# Patient Record
Sex: Male | Born: 1965 | Race: Black or African American | Hispanic: No
Health system: Southern US, Community
[De-identification: ages and names within clinical notes are randomized; demographics above are authoritative.]

## PROBLEM LIST (undated history)

## (undated) DIAGNOSIS — S060XAA Concussion with loss of consciousness status unknown, initial encounter: Secondary | ICD-10-CM

## (undated) DIAGNOSIS — S060X9A Concussion with loss of consciousness of unspecified duration, initial encounter: Secondary | ICD-10-CM

## (undated) DIAGNOSIS — I10 Essential (primary) hypertension: Secondary | ICD-10-CM

## (undated) HISTORY — PX: ANKLE SURGERY: SHX546

## (undated) HISTORY — DX: Concussion with loss of consciousness of unspecified duration, initial encounter: S06.0X9A

## (undated) HISTORY — DX: Concussion with loss of consciousness status unknown, initial encounter: S06.0XAA

## (undated) HISTORY — PX: OTHER SURGICAL HISTORY: SHX169

---

## 1999-03-19 ENCOUNTER — Encounter: Payer: Self-pay | Admitting: Internal Medicine

## 1999-03-19 ENCOUNTER — Emergency Department (HOSPITAL_COMMUNITY): Admission: EM | Admit: 1999-03-19 | Discharge: 1999-03-19 | Payer: Self-pay | Admitting: Internal Medicine

## 1999-04-10 ENCOUNTER — Encounter: Payer: Self-pay | Admitting: Specialist

## 1999-04-10 ENCOUNTER — Encounter: Payer: Self-pay | Admitting: Emergency Medicine

## 1999-04-10 ENCOUNTER — Inpatient Hospital Stay (HOSPITAL_COMMUNITY): Admission: EM | Admit: 1999-04-10 | Discharge: 1999-04-18 | Payer: Self-pay

## 1999-04-10 ENCOUNTER — Encounter: Payer: Self-pay | Admitting: General Surgery

## 1999-04-13 ENCOUNTER — Encounter: Payer: Self-pay | Admitting: Specialist

## 1999-04-13 ENCOUNTER — Encounter: Payer: Self-pay | Admitting: Rheumatology

## 1999-04-16 ENCOUNTER — Encounter: Payer: Self-pay | Admitting: Specialist

## 2003-06-04 ENCOUNTER — Emergency Department (HOSPITAL_COMMUNITY): Admission: AD | Admit: 2003-06-04 | Discharge: 2003-06-04 | Payer: Self-pay | Admitting: Family Medicine

## 2003-06-06 ENCOUNTER — Emergency Department (HOSPITAL_COMMUNITY): Admission: AD | Admit: 2003-06-06 | Discharge: 2003-06-06 | Payer: Self-pay | Admitting: Family Medicine

## 2004-01-29 ENCOUNTER — Emergency Department (HOSPITAL_COMMUNITY): Admission: EM | Admit: 2004-01-29 | Discharge: 2004-01-29 | Payer: Self-pay | Admitting: Emergency Medicine

## 2004-02-18 ENCOUNTER — Emergency Department (HOSPITAL_COMMUNITY): Admission: EM | Admit: 2004-02-18 | Discharge: 2004-02-18 | Payer: Self-pay | Admitting: Family Medicine

## 2004-02-27 ENCOUNTER — Emergency Department (HOSPITAL_COMMUNITY): Admission: EM | Admit: 2004-02-27 | Discharge: 2004-02-27 | Payer: Self-pay | Admitting: Family Medicine

## 2004-06-05 ENCOUNTER — Ambulatory Visit (HOSPITAL_BASED_OUTPATIENT_CLINIC_OR_DEPARTMENT_OTHER): Admission: RE | Admit: 2004-06-05 | Discharge: 2004-06-05 | Payer: Self-pay | Admitting: Orthopedic Surgery

## 2004-06-05 ENCOUNTER — Encounter (INDEPENDENT_AMBULATORY_CARE_PROVIDER_SITE_OTHER): Payer: Self-pay | Admitting: Specialist

## 2004-06-05 ENCOUNTER — Ambulatory Visit (HOSPITAL_COMMUNITY): Admission: RE | Admit: 2004-06-05 | Discharge: 2004-06-05 | Payer: Self-pay | Admitting: Orthopedic Surgery

## 2004-10-11 ENCOUNTER — Encounter (INDEPENDENT_AMBULATORY_CARE_PROVIDER_SITE_OTHER): Payer: Self-pay | Admitting: Specialist

## 2004-10-11 ENCOUNTER — Ambulatory Visit (HOSPITAL_BASED_OUTPATIENT_CLINIC_OR_DEPARTMENT_OTHER): Admission: RE | Admit: 2004-10-11 | Discharge: 2004-10-11 | Payer: Self-pay | Admitting: Orthopedic Surgery

## 2004-10-11 ENCOUNTER — Ambulatory Visit (HOSPITAL_COMMUNITY): Admission: RE | Admit: 2004-10-11 | Discharge: 2004-10-11 | Payer: Self-pay | Admitting: Orthopedic Surgery

## 2010-04-19 ENCOUNTER — Emergency Department (HOSPITAL_COMMUNITY): Admission: EM | Admit: 2010-04-19 | Discharge: 2010-04-19 | Payer: Self-pay | Admitting: Emergency Medicine

## 2010-04-24 ENCOUNTER — Ambulatory Visit: Payer: Self-pay | Admitting: Internal Medicine

## 2010-10-19 NOTE — Op Note (Signed)
[name], [name]              ACCOUNT NO.:  [id number]   MEDICAL RECORD NO.:  [id number]          PATIENT TYPE:  AMB   LOCATION:  DSC                          FACILITY:  [facility]   PHYSICIAN:  Katy Fitch. Sypher Montez Hageman., M.D.DATE OF BIRTH:  June 14, 1965   DATE OF PROCEDURE:  06/05/2004  DATE OF DISCHARGE:                                 OPERATIVE REPORT   PREOPERATIVE DIAGNOSIS:  Status post profound crushing injury, left thumb  distal phalangeal segment, with extensive nail matrix disruption and  severely comminuted fracture of left thumb distal phalanx, with development  of dystrophic thumb with a large radial osteophyte at base of distal  phalanx, a 20% radial nail plate nail horn deformity with a split nail, and  asensate hyponychial skin at the distal pulp.   POSTOPERATIVE DIAGNOSIS:  Status post profound crushing injury, left thumb  distal phalangeal segment, with extensive nail matrix disruption and  severely comminuted fracture of left thumb distal phalanx, with development  of dystrophic thumb with a large radial osteophyte at base of distal  phalanx, a 20% radial nail plate nail horn deformity with a split nail, and  asensate hyponychial skin at the distal pulp.   OPERATION:  1.  Shortening of left thumb pulp approximately 5 mm to remove the majority      of the asensate pulp and hyponychial skin.  2.  Resection of distal phalangeal osteophyte due to malunion of distal      phalanx, i.e., cheilectomy of left thumb distal phalanx.  3.  Resection of dorsal intermediate and ventral nail fold and nail plate of      radial nail horn.   OPERATING SURGEON:  Katy Fitch. Sypher, M.D.   ASSISTANT:  Jonni Sanger, P.A.   ANESTHESIA:  0.25% Marcaine and 2% lidocaine metacarpal-head level block of  left thumb supplemented by IV sedation, supervising anesthesiologist is W.  Autumn Patty, M.D.   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  He was initially treated in [place] with  irrigation and debridement of his wound, followed by closed reduction of his  distal phalangeal comminuted fracture and repair of his nail.   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   He had been provided an impairment rating by another orthopedic surgeon and  was frustrated with the performance of his thumb.   A hand surgery consult was requested.   Clinical examination revealed signs of a malunion of his phalanx with a  prominent osteophyte, which could easily be remedied by removal, as well as  a split nail deformity that could be remedied by nail matrix resection.  His  asensate portion of the thumb could be improved by partial skin resection  and slight shortening of the thumb.   We had a lengthy informed consent with Benjamin [name] at our office, 45  explaining these potential remedies to him.   After a period of deliberation, he decided that he would like to proceed  with revision of his left thumb.   After obtaining approval from the insurance carrier, he is brought to the  operating room at this time anticipating radial nail horn ablation with  resection of the ventral, intermediate, and dorsal nail fold to prevent  recurrence, and resection of his osteophyte and partial shortening of the  thumb to relieve his area of asensate skin as much as feasible.   After questions were invited and answered, he is brought to the operating  room at this time.   PROCEDURE:  Benjamin [name] was brought to the operating room and placed in  the  supine position on the operating table.  Following light sedation, the  left arm was prepped with Betadine soap and solution and sterilely draped.  Marcaine 0.25% and 2% lidocaine were infiltrated at the metacarpal head  level to attain a digital block of the left thumb.   When anesthesia was satisfactory, the procedure commenced with planning of  the nail resection.   The dystrophic nail plate was identified and the nail split ulnar to the  nail horn with tenotomy scissors.  A 15 scalpel blade was then used to  perform a full-thickness nail resection, including the dorsal, intermediate,  and ventral nail fold all the way down to the distal phalanx.  A small  portion of the radial side of the skin was also removed to prevent  recurrence of a nail horn deformity.   The skin on the radial aspect of the thumb was then undermined, exposing the  osteophyte from the malunion of the metaphysis and epiphysis on the radial  aspect of the base of the distal phalanx with a 2 mm osteotome, followed by  use of the osteotome to resect the osteophyte.  A Martini microcurette was  used to remove remnants of the bone.   A PA and oblique C-arm image of the thumb was obtained, documenting  satisfactory resection of the osteophyte.   The distal portion of the ventral nail fold and a portion of the distal pulp  inferior to the distal nail was resected and the skin tailored to allow  closure with a palmar and ulnar-based portion of the remaining pulp skin.   The area of nail resection was tailored by removal of a small triangle of  skin from the radial nail fold and tailored with inset with multiple  interrupted sutures of 4-0 nylon, creating a very cosmetic-appearing nail  that was 80% of its normal width and approximately 80% of its normal length.   There were no other difficulties encountered.  The Penrose drain that was used as a tourniquet during the procedure was  removed over the P1 segment.   Immediate capillary refill to the pulp and  nail bed was achieved.  The thumb was dressed with Xeroflo, sterile gauze,  and Coban.  There were no apparent complications.   For aftercare, Benjamin [name] is instructed to elevate his hand for at least  one week and to begin immediate range of motion exercises to his fingers.   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   He is given a prescription for Dilaudid 2 mg one p.o. q.4-6h. p.r.n. pain,  30 tablets without refill, also Keflex 500 mg one p.o. q.8h. x4 days as a  prophylactic antibiotic.  I have encouraged him to take Advil or Aleve as a  nonsteroidal pain medication for the first week, following label directions.      Robe   RVS/MEDQ  D:  06/05/2004  T:  06/05/2004  Job:  [id number]

## 2010-10-19 NOTE — Op Note (Signed)
Benjamin [name], Benjamin [name]              ACCOUNT NO.:  [id number]   MEDICAL RECORD NO.:  [id number]          PATIENT TYPE:  AMB   LOCATION:  DSC                          FACILITY:  [facility]   PHYSICIAN:  Katy Fitch. Sypher Montez Hageman., M.D.DATE OF BIRTH:  1965/07/11   DATE OF PROCEDURE:  10/11/2004  DATE OF DISCHARGE:                                 OPERATIVE REPORT   PREOPERATIVE DIAGNOSIS:  Chronic foreign body granuloma right elbow due to  presumed penetrating foreign body more than one year prior with development  of a fluctuant mass on the dorsal radial aspect of the proximal right  forearm.   POSTOPERATIVE DIAGNOSIS:  Toothpick foreign body deep to the extensor  muscles adjacent to radial tunnel with secondary foreign body granuloma  formation with large subcutaneous granuloma dorsal radial aspect of proximal  right olecranon.   OPERATION:  Excision of foreign body granuloma and resection of toothpick  foreign body.   OPERATING SURGEON:  Katy Fitch. Sypher, M.D.   ASSISTANT:  Molly Maduro Dasnoit PA-C.   ANESTHESIA:  General by LMA.   SUPERVISING ANESTHESIOLOGIST:  Dr. Jairo Ben.   INDICATIONS:  Benjamin [name] is a 45 year old gentleman who presented for  evaluation of a mass on the proximal right forearm. He reported a possible  penetrating injury a more than one year prior. He thought he may have  retained foreign body.   He has noted a fluctuant mass measuring approximately 3 cm x 2.5 cm that was  quite tender to touch. He also noted pain when he would lean on his proximal  forearm radial aspect.   Plain films of his elbow were nondiagnostic. He did not have a radiopaque  foreign body noted. There was no gas present on the films.   He requested that we explore his arm and remove the foreign body.   PROCEDURE:  Benjamin [name] is brought to the operating room and placed in  supine position on the operating table. Following anesthesia consultation by  Dr. Jean Rosenthal, general anesthesia  by LMA technique was selected. General  anesthesia was induced under direct vision of Dr. Jean Rosenthal, followed by  routine Betadine scrub and paint of the right upper extremity.   Stockinette and impervious arthroscopy drapes were applied followed by  exsanguination of the right arm with an Esmarch bandage and inflation of  arterial tourniquet in the proximal brachium to 220 mmHg. Her procedure  commenced with a 2.5 cm long incision directly over the apex of the mass..  Subcutaneous tissues of the proximal forearm were gently dissected,  revealing a very extensive foreign body granuloma that was quite fibrotic.   This was ultimately entered and immediately amber colored hemosiderin  stained fluid was recovered.   The fluid was cultured for aerobic and anaerobic growth.   The granuloma was then resected piecemeal, followed by identification of a  sinus tract deep to the extensor muscles.   The sinus tract was probed and after use of by manual pressure, a toothpick  was identified.   The toothpick measured more than 3 cm in length and was more than one half  of  a standard round sharp toothpick.   The sinus tract was gently probed with a blunt curette to remove all foreign  material and granulation tissue.   Specimens were sent for fungal smear and culture as well.   The wound was then thoroughly irrigated followed by repair the skin with  subdermal sutures of 4-0 Vicryl and intradermal 3-0 Prolene.   A wick drain of Xeroflo was placed to allow the egress of serous fluid from  the sinus tract.   Benjamin [name] tolerated surgery and anesthesia well. He was transferred to  the recovery room with stable vital signs.      RVS/MEDQ  D:  10/11/2004  T:  10/11/2004  Job:  [id number]

## 2012-02-07 ENCOUNTER — Emergency Department (INDEPENDENT_AMBULATORY_CARE_PROVIDER_SITE_OTHER): Payer: BC Managed Care – PPO

## 2012-02-07 ENCOUNTER — Emergency Department (HOSPITAL_COMMUNITY)
Admission: EM | Admit: 2012-02-07 | Discharge: 2012-02-07 | Disposition: A | Discharge status: Home / Self Care | Payer: BC Managed Care – PPO | Source: Home / Self Care | Attending: Emergency Medicine | Admitting: Emergency Medicine

## 2012-02-07 ENCOUNTER — Emergency Department (HOSPITAL_COMMUNITY): Payer: BC Managed Care – PPO

## 2012-02-07 ENCOUNTER — Encounter (HOSPITAL_COMMUNITY): Payer: Self-pay | Admitting: Emergency Medicine

## 2012-02-07 DIAGNOSIS — S335XXA Sprain of ligaments of lumbar spine, initial encounter: Secondary | ICD-10-CM

## 2012-02-07 DIAGNOSIS — S43429A Sprain of unspecified rotator cuff capsule, initial encounter: Secondary | ICD-10-CM

## 2012-02-07 DIAGNOSIS — S139XXA Sprain of joints and ligaments of unspecified parts of neck, initial encounter: Secondary | ICD-10-CM

## 2012-02-07 DIAGNOSIS — S39012A Strain of muscle, fascia and tendon of lower back, initial encounter: Secondary | ICD-10-CM

## 2012-02-07 DIAGNOSIS — S46019A Strain of muscle(s) and tendon(s) of the rotator cuff of unspecified shoulder, initial encounter: Secondary | ICD-10-CM

## 2012-02-07 DIAGNOSIS — S161XXA Strain of muscle, fascia and tendon at neck level, initial encounter: Secondary | ICD-10-CM

## 2012-02-07 HISTORY — DX: Essential (primary) hypertension: I10

## 2012-02-07 MED ORDER — HYDROCODONE-ACETAMINOPHEN 5-325 MG PO TABS: ORAL_TABLET | ORAL | Status: AC

## 2012-02-07 MED ORDER — HYDROCODONE-ACETAMINOPHEN 5-325 MG PO TABS
ORAL_TABLET | ORAL | Status: AC
  Filled 2012-02-07: qty 1

## 2012-02-07 MED ORDER — DICLOFENAC SODIUM 75 MG PO TBEC: 75.0000 mg | DELAYED_RELEASE_TABLET | Freq: Two times a day (BID) | ORAL | Status: AC

## 2012-02-07 MED ORDER — HYDROCODONE-ACETAMINOPHEN 5-325 MG PO TABS
1.0000 | ORAL_TABLET | Freq: Once | ORAL | Status: AC
Start: 2012-02-07 — End: 2012-02-07
  Administered 2012-02-07: 1 via ORAL

## 2012-02-07 MED ORDER — CYCLOBENZAPRINE HCL 5 MG PO TABS: 5.0000 mg | ORAL_TABLET | Freq: Three times a day (TID) | ORAL | Status: AC | PRN
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
## 2012-07-14 ENCOUNTER — Ambulatory Visit: Payer: BC Managed Care – PPO | Admitting: Adult Health

## 2012-10-15 ENCOUNTER — Other Ambulatory Visit (HOSPITAL_COMMUNITY): Payer: Self-pay | Admitting: Occupational Medicine

## 2012-10-15 DIAGNOSIS — M79662 Pain in left lower leg: Secondary | ICD-10-CM

## 2012-10-16 ENCOUNTER — Ambulatory Visit (HOSPITAL_COMMUNITY): Payer: Worker's Compensation

## 2013-04-23 ENCOUNTER — Other Ambulatory Visit: Payer: Self-pay | Admitting: Family Medicine

## 2013-04-23 ENCOUNTER — Ambulatory Visit
Admission: RE | Admit: 2013-04-23 | Discharge: 2013-04-23 | Disposition: A | Discharge status: Home / Self Care | Payer: Self-pay | Source: Ambulatory Visit | Attending: Family Medicine | Admitting: Family Medicine

## 2013-04-23 ENCOUNTER — Ambulatory Visit
Admission: RE | Admit: 2013-04-23 | Discharge: 2013-04-23 | Disposition: A | Discharge status: Home / Self Care | Payer: BC Managed Care – PPO | Source: Ambulatory Visit | Attending: Family Medicine | Admitting: Family Medicine

## 2013-04-23 DIAGNOSIS — R52 Pain, unspecified: Secondary | ICD-10-CM

## 2014-08-19 ENCOUNTER — Other Ambulatory Visit: Payer: Self-pay | Admitting: Family Medicine

## 2014-08-19 ENCOUNTER — Ambulatory Visit
Admission: RE | Admit: 2014-08-19 | Discharge: 2014-08-19 | Disposition: A | Discharge status: Home / Self Care | Payer: BLUE CROSS/BLUE SHIELD | Source: Ambulatory Visit | Attending: Family Medicine | Admitting: Family Medicine

## 2014-08-19 DIAGNOSIS — M25511 Pain in right shoulder: Principal | ICD-10-CM

## 2014-08-19 DIAGNOSIS — G8929 Other chronic pain: Secondary | ICD-10-CM

## 2015-01-04 DIAGNOSIS — I209 Angina pectoris, unspecified: Secondary | ICD-10-CM

## 2015-01-04 NOTE — H&P (Addendum)
OFFICE VISIT NOTES COPIED TO EPIC FOR DOCUMENTATION   Benjamin [name] 01-22-2015 8:17 AM Location: [facility] Patient #: [id number] DOB: [birth year] Divorced / Language: English / Race: Black or African American Male  HOPI: The patient is a 49 year old male who presents with chest pain. Symptoms include chest pain and dyspnea. The pain is located in the substernal area and left anterior chest. There is no radiation. The patient describes the pain as heavy. Onset was sudden 2 week(s) ago. Onset followed exertion. The symptoms occur intermittently and with activity. The patient describes this as moderate in severity and worsening. Symptoms are exacerbated by exertion. Symptoms are relieved by resting. Current treatment includes aspirin and ARB. By report there is good compliance with treatment and good tolerance of treatment. Risk factors include hypertension, while risk factors do not include atherogenic diet, cigarette smoking, diabetes, obesity or sedentary lifestyle. He underwent stress testing and echocardiogram and presents for f/u. Patient continues to have dyspnea even with minimal exertion even with optimal medical therapy. He has not had any recurrent chest pain, however he has not done any exertion and has been out of work since last visit.   Medication History (Bridgette Ebony Hail, AGNP-C; 2015-01-22 1:54 PM) AmLODIPine Besylate (5MG Tablet, 1 (one) Tablet Oral daily, Taken starting 12/25/2014) Discontinued. (pt already taking 29m, not on med list) AmLODIPine Besylate (10MG Tablet, 1 Oral daily) Active. Metoprolol Tartrate (25MG Tablet, 1 (one) Tablet Oral two times daily, Taken starting 12/21/2014) Active. Nitrostat (0.4MG Tab Sublingual, 1 (one) Tab Sublingual Sublingual every 5 minutes as needed for chest pain up to 3 doses, Taken starting 12/21/2014) Active. Atorvastatin Calcium (40MG Tablet, 1 (one) Tablet Oral daily, Taken starting 12/25/2014) Active. Losartan Potassium  (100MG Tablet, 1 Oral daily) Active. Loratadine (10MG Tablet, 1 Oral daily) Active. Duexis (800-26.6MG Tablet, 1 Oral three times daily) Active. Aspirin EC Low Strength (81MG Tablet DR, 2 Oral daily) Active. Medications Reconciled  Diagnostic Studies History (Adonis BrookBeane; [id number]:17 AM) Nuclear stress test07/22/2016 1. Patient exercised on a Bruce protocol for 9 minutes and 31 seconds and achieved 10.97 METS. However stress EKG was nondiagnostic as patient achieved only 75% of age-predicted maximum heart rate(fatigue, no chest pain). Hence stress test converted to LFergus Fallssestamibi stress test. Resting EKG demonstrates normal sinus rhythm with nonspecific T abnormality in the lateral leads. Stress EKG was positive for ischemia at submaximal heart rate, 75% MPHR with 2 mm horizontal ST depression noted at peak exercise which reverted to baseline at 2 minutes into recovery noted in inferior and lateral leads. Normal exercise tolerence. Mildly hypotensive BP response at peak exercise, 1:30/70 mmHg at rest reduced to 122/74 mmHg at peak exercise. With Lexiscan infusion there was nonspecific T-wave inversion in the inferior leads and lateral leads. 2. The perfusion imaging study demonstrates mild diaphragmatic attenuation artifact in the inferior wall. There is no evidence of ischemia. Left ventricle systolic function Calculated by QGS was mildly depressed at 48%. Overall this represents an intermediate risk scan due to decreased ejection fraction and abnormal EKG response to exercise at submaximal heart (75% MPHR) and also with Lexiscan infusion. Clinical correlation is recommended.   Review of Systems (Bridgette AEbony HailAGNP-C; 72016-08-211:53 PM) General Not Present- Anorexia, Fatigue and Fever. Respiratory Present- Difficulty Breathing on Exertion. Not Present- Cough and Decreased Exercise Tolerance. Cardiovascular Not Present- Chest Pain, Claudications, Edema, Orthopnea, Paroxysmal Nocturnal  Dyspnea and Shortness of Breath. Gastrointestinal Not Present- Change in Bowel Habits, Constipation and Nausea. Neurological Not Present- Focal  Neurological Symptoms. Endocrine Not Present- Appetite Changes, Cold Intolerance and Heat Intolerance. Hematology Not Present- Anemia, Petechiae and Prolonged Bleeding. Vitals (Bridgette Allison AGNP-C; 12/27/2014 1:53 PM) 12/27/2014 1:53 PM Pulse: 68 (Regular)  BP: 124/82 (Sitting, Left Arm, Standard)  Assessment & Plan (Bridgette Allison AGNP-C; 12/27/2014 1:56 PM) Abnormal nuclear stress test (R94.39) Angina pectoris (I20.9) Story: Lexiscan sestamibi stress test 12/23/2014: 1. Patient exercised on a Bruce protocol for 9 minutes and 31 seconds and achieved 10.97 METS. However stress EKG was nondiagnostic as patient achieved only 75% of age-predicted maximum heart rate(fatigue, no chest pain). Hence stress test converted to [place] sestamibi stress test. Resting EKG demonstrates normal sinus rhythm with nonspecific T abnormality in the lateral leads. Stress EKG was positive for ischemia at submaximal heart rate, 75% MPHR with 2 mm horizontal ST depression noted at peak exercise which reverted to baseline at 2 minutes into recovery noted in inferior and lateral leads. Normal exercise tolerence. Mildly hypotensive BP response at peak exercise, 1:30/70 mmHg at rest reduced to 122/74 mmHg at peak exercise. With Lexiscan infusion there was nonspecific T-wave inversion in the inferior leads and lateral leads. 2. The perfusion imaging study demonstrates mild diaphragmatic attenuation artifact in the inferior wall. There is no evidence of ischemia. Left ventricle systolic function Calculated by QGS was mildly depressed at 48%. Overall this represents an intermediate risk scan due to decreased ejection fraction and abnormal EKG response to exercise at submaximal heart (75% MPHR) and also with Lexiscan infusion. Clinical correlation is recommended.  EKG 12/21/2014:  Sinus rhythm at a rate of 75 bpm, normal axis, normal intervals, T wave inversion in V3 and inferior and lateral leads, cannot exclude inferolateral ischemia. Compared to EKG on 12/19/2014 performed by PCP, T wave inversion in inferior leads is more prominent and new in precordial leads.  Current Plans Discussed stress test results with patient while in office for echocardiogram today. Patient continues to have dyspnea even with minimal exertion even with optimal medical therapy. He has not had any recurrent chest pain, however he has not done any exertion and has been out of work since last visit. We discussed his options for continued medical therapy versus coronary angiogram. Schedule for cardiac catheterization, and possible angioplasty. We discussed regarding risks, benefits, alternatives to this including stress testing, CTA, and continued medical therapy. Patient wants to proceed. Understands <1-2% risk of death, stroke, MI, urgent CABG, bleeding, infection, renal failure but not limited to these. Video recording of the procedure shown to the patient.  I have personally reviewed the patient's record and performed physical exam and agree with the assessment and plan of Ms. Neldon Labella, NP-C.  Adrian Prows, MD 01/04/2015, 6:44 PM [facility] Pager: [phone] Office: [phone] If no answer: Cell:  [phone]  Labs 01/04/2015: BUN 16, serum creatinine 1.18, eGFR 71 mL.  CBC normal, PT/PTT normal.  Labs are within normal limits and can proceed with coronary angiography.

## 2015-01-06 ENCOUNTER — Ambulatory Visit (HOSPITAL_COMMUNITY)
Admission: RE | Admit: 2015-01-06 | Discharge: 2015-01-06 | Disposition: A | Discharge status: Home / Self Care | Payer: BLUE CROSS/BLUE SHIELD | Source: Ambulatory Visit | Attending: Cardiology | Admitting: Cardiology

## 2015-01-06 ENCOUNTER — Encounter (HOSPITAL_COMMUNITY)
Admission: RE | Disposition: A | Discharge status: Home / Self Care | Payer: BLUE CROSS/BLUE SHIELD | Source: Ambulatory Visit | Attending: Cardiology

## 2015-01-06 ENCOUNTER — Encounter (HOSPITAL_COMMUNITY): Payer: Self-pay | Admitting: Cardiology

## 2015-01-06 DIAGNOSIS — Z7982 Long term (current) use of aspirin: Secondary | ICD-10-CM | POA: Diagnosis not present

## 2015-01-06 DIAGNOSIS — E785 Hyperlipidemia, unspecified: Secondary | ICD-10-CM | POA: Insufficient documentation

## 2015-01-06 DIAGNOSIS — I10 Essential (primary) hypertension: Secondary | ICD-10-CM | POA: Diagnosis not present

## 2015-01-06 DIAGNOSIS — R0600 Dyspnea, unspecified: Secondary | ICD-10-CM | POA: Insufficient documentation

## 2015-01-06 DIAGNOSIS — R079 Chest pain, unspecified: Secondary | ICD-10-CM | POA: Diagnosis present

## 2015-01-06 DIAGNOSIS — Z79899 Other long term (current) drug therapy: Secondary | ICD-10-CM | POA: Diagnosis not present

## 2015-01-06 DIAGNOSIS — I209 Angina pectoris, unspecified: Secondary | ICD-10-CM

## 2015-01-06 HISTORY — PX: CARDIAC CATHETERIZATION: SHX172

## 2015-01-06 SURGERY — LEFT HEART CATH AND CORONARY ANGIOGRAPHY
Anesthesia: LOCAL

## 2015-01-06 MED ORDER — MIDAZOLAM HCL 2 MG/2ML IJ SOLN
INTRAMUSCULAR | Status: DC | PRN
  Administered 2015-01-06: 2 mg via INTRAVENOUS

## 2015-01-06 MED ORDER — SODIUM CHLORIDE 0.9 % IJ SOLN: 3.0000 mL | Freq: Two times a day (BID) | INTRAMUSCULAR | Status: DC

## 2015-01-06 MED ORDER — NITROGLYCERIN 1 MG/10 ML FOR IR/CATH LAB
INTRA_ARTERIAL | Status: DC | PRN
  Administered 2015-01-06: 200 ug via INTRACORONARY

## 2015-01-06 MED ORDER — NITROGLYCERIN 1 MG/10 ML FOR IR/CATH LAB
INTRA_ARTERIAL | Status: AC
  Filled 2015-01-06: qty 10

## 2015-01-06 MED ORDER — IOHEXOL 350 MG/ML SOLN
INTRAVENOUS | Status: DC | PRN
  Administered 2015-01-06: 100 mL via INTRAVENOUS

## 2015-01-06 MED ORDER — VERAPAMIL HCL 2.5 MG/ML IV SOLN
INTRAVENOUS | Status: AC
  Filled 2015-01-06: qty 2

## 2015-01-06 MED ORDER — SODIUM CHLORIDE 0.9 % IV SOLN: 250.0000 mL | INTRAVENOUS | Status: DC | PRN

## 2015-01-06 MED ORDER — MIDAZOLAM HCL 2 MG/2ML IJ SOLN
INTRAMUSCULAR | Status: AC
  Filled 2015-01-06: qty 4

## 2015-01-06 MED ORDER — HEPARIN SODIUM (PORCINE) 1000 UNIT/ML IJ SOLN
INTRAMUSCULAR | Status: DC | PRN
  Administered 2015-01-06: 6000 [IU] via INTRAVENOUS

## 2015-01-06 MED ORDER — SODIUM CHLORIDE 0.9 % IJ SOLN: 3.0000 mL | INTRAMUSCULAR | Status: DC | PRN

## 2015-01-06 MED ORDER — SODIUM CHLORIDE 0.9 % WEIGHT BASED INFUSION: 3.0000 mL/kg/h | INTRAVENOUS | Status: DC

## 2015-01-06 MED ORDER — HEPARIN (PORCINE) IN NACL 2-0.9 UNIT/ML-% IJ SOLN
INTRAMUSCULAR | Status: AC
  Filled 2015-01-06: qty 1000

## 2015-01-06 MED ORDER — RADIAL COCKTAIL (HEPARIN/VERAPAMIL/LIDOCAINE/NITRO)
Status: DC | PRN
  Administered 2015-01-06: 1 via INTRA_ARTERIAL

## 2015-01-06 MED ORDER — SODIUM CHLORIDE 0.9 % WEIGHT BASED INFUSION
3.0000 mL/kg/h | INTRAVENOUS | Status: DC
  Administered 2015-01-06: 3 mL/kg/h via INTRAVENOUS

## 2015-01-06 MED ORDER — HEPARIN SODIUM (PORCINE) 1000 UNIT/ML IJ SOLN
INTRAMUSCULAR | Status: AC
  Filled 2015-01-06: qty 1

## 2015-01-06 MED ORDER — NITROGLYCERIN 1 MG/10 ML FOR IR/CATH LAB
INTRA_ARTERIAL | Status: DC | PRN
  Administered 2015-01-06: 08:00:00

## 2015-01-06 MED ORDER — FENTANYL CITRATE (PF) 100 MCG/2ML IJ SOLN
INTRAMUSCULAR | Status: AC
  Filled 2015-01-06: qty 4

## 2015-01-06 MED ORDER — ASPIRIN 81 MG PO CHEW: 81.0000 mg | CHEWABLE_TABLET | ORAL | Status: DC

## 2015-01-06 MED ORDER — LIDOCAINE HCL (PF) 1 % IJ SOLN
INTRAMUSCULAR | Status: AC
  Filled 2015-01-06: qty 30

## 2015-01-06 MED ORDER — SODIUM CHLORIDE 0.9 % WEIGHT BASED INFUSION: 1.0000 mL/kg/h | INTRAVENOUS | Status: DC

## 2015-01-06 MED ORDER — FENTANYL CITRATE (PF) 100 MCG/2ML IJ SOLN
INTRAMUSCULAR | Status: DC | PRN
  Administered 2015-01-06: 25 ug via INTRAVENOUS

## 2015-01-06 SURGICAL SUPPLY — 10 items
CATH INFINITI 5FR ANG PIGTAIL (CATHETERS) ×1 IMPLANT
CATH OPTITORQUE TIG 4.0 5F (CATHETERS) ×2 IMPLANT
DEVICE RAD COMP TR BAND LRG (VASCULAR PRODUCTS) ×2 IMPLANT
GLIDESHEATH SLEND A-KIT 6F 20G (SHEATH) ×2 IMPLANT
KIT HEART LEFT (KITS) ×2 IMPLANT
PACK CARDIAC CATHETERIZATION (CUSTOM PROCEDURE TRAY) ×2 IMPLANT
SYR MEDRAD MARK V 150ML (SYRINGE) ×1 IMPLANT
TRANSDUCER W/STOPCOCK (MISCELLANEOUS) ×2 IMPLANT
TUBING CIL FLEX 10 FLL-RA (TUBING) ×2 IMPLANT
WIRE SAFE-T 1.5MM-J .035X260CM (WIRE) ×2 IMPLANT

## 2015-01-06 NOTE — Interval H&P Note (Signed)
History and Physical Interval Note:  01/06/2015 7:45 AM  Benjamin [name]  has presented today for surgery, with the diagnosis of abnormal stress test  The various methods of treatment have been discussed with the patient and family. After consideration of risks, benefits and other options for treatment, the patient has consented to  Procedure(s): Left Heart Cath and Coronary Angiography (N/A) and possible PCI as a surgical intervention .  The patient's history has been reviewed, patient examined, no change in status, stable for surgery.  I have reviewed the patient's chart and labs.  Questions were answered to the patient's satisfaction.   Ischemic Symptoms? CCS III (Marked limitation of ordinary activity) Anti-ischemic Medical Therapy? Maximal Medical Therapy (2 or more classes of medications) Non-invasive Test Results? Intermediate-risk stress test findings: cardiac mortality 1-3%/year Prior CABG? No Previous CABG   Patient Information:   1-2V CAD, no prox LAD  A (8)  Indication: 17; Score: 8   Patient Information:   CTO of 1 vessel, no other CAD  A (7)  Indication: 27; Score: 7   Patient Information:   1V CAD with prox LAD  A (9)  Indication: 33; Score: 9   Patient Information:   2V-CAD with prox LAD  A (9)  Indication: 39; Score: 9   Patient Information:   3V-CAD without LMCA  A (9)  Indication: 45; Score: 9   Patient Information:   3V-CAD without LMCA With Abnormal LV systolic function  A (9)  Indication: 48; Score: 9   Patient Information:   LMCA-CAD  A (9)  Indication: 49; Score: 9   Patient Information:   2V-CAD with prox LAD PCI  A (7)  Indication: 62; Score: 7   Patient Information:   2V-CAD with prox LAD CABG  A (8)  Indication: 62; Score: 8   Patient Information:   3V-CAD without LMCA With Low CAD burden(i.e., 3 focal stenoses, low SYNTAX score) PCI  A (7)  Indication: 63; Score: 7   Patient Information:   3V-CAD  without LMCA With Low CAD burden(i.e., 3 focal stenoses, low SYNTAX score) CABG  A (9)  Indication: 63; Score: 9   Patient Information:   3V-CAD without LMCA E06c - Intermediate-high CAD burden (i.e., multiple diffuse lesions, presence of CTO, or high SYNTAX score) PCI  U (4)  Indication: 64; Score: 4   Patient Information:   3V-CAD without LMCA E06c - Intermediate-high CAD burden (i.e., multiple diffuse lesions, presence of CTO, or high SYNTAX score) CABG  A (9)  Indication: 64; Score: 9   Patient Information:   LMCA-CAD With Isolated LMCA stenosis  PCI  U (6)  Indication: 65; Score: 6   Patient Information:   LMCA-CAD With Isolated LMCA stenosis  CABG  A (9)  Indication: 65; Score: 9   Patient Information:   LMCA-CAD Additional CAD, low CAD burden (i.e., 1- to 2-vessel additional involvement, low SYNTAX score) PCI  U (5)  Indication: 66; Score: 5   Patient Information:   LMCA-CAD Additional CAD, low CAD burden (i.e., 1- to 2-vessel additional involvement, low SYNTAX score) CABG  A (9)  Indication: 66; Score: 9   Patient Information:   LMCA-CAD Additional CAD, intermediate-high CAD burden (i.e., 3-vessel involvement, presence of CTO, or high SYNTAX score) PCI  I (3)  Indication: 67; Score: 3   Patient Information:   LMCA-CAD Additional CAD, intermediate-high CAD burden (i.e., 3-vessel involvement, presence of CTO, or high SYNTAX score) CABG  A (9)  Indication: 67; Score:  [name]

## 2015-01-06 NOTE — Discharge Instructions (Signed)
Radial Site Care °Refer to this sheet in the next few weeks. These instructions provide you with information on caring for yourself after your procedure. Your caregiver may also give you more specific instructions. Your treatment has been planned according to current medical practices, but problems sometimes occur. Call your caregiver if you have any problems or questions after your procedure. °HOME CARE INSTRUCTIONS °· You may shower the day after the procedure. Remove the bandage (dressing) and gently wash the site with plain soap and water. Gently pat the site dry. °· Do not apply powder or lotion to the site. °· Do not submerge the affected site in water for 3 to 5 days. °· Inspect the site at least twice daily. °· Do not flex or bend the affected arm for 24 hours. °· No lifting over 5 pounds (2.3 kg) for 5 days after your procedure. °· Do not drive home if you are discharged the same day of the procedure. Have someone else drive you. °· You may drive 24 hours after the procedure unless otherwise instructed by your caregiver. °· Do not operate machinery or power tools for 24 hours. °· A responsible adult should be with you for the first 24 hours after you arrive home. °What to expect: °· Any bruising will usually fade within 1 to 2 weeks. °· Blood that collects in the tissue (hematoma) may be painful to the touch. It should usually decrease in size and tenderness within 1 to 2 weeks. °SEEK IMMEDIATE MEDICAL CARE IF: °· You have unusual pain at the radial site. °· You have redness, warmth, swelling, or pain at the radial site. °· You have drainage (other than a small amount of blood on the dressing). °· You have chills. °· You have a fever or persistent symptoms for more than 72 hours. °· You have a fever and your symptoms suddenly get worse. °· Your arm becomes pale, cool, tingly, or numb. °· You have heavy bleeding from the site. Hold pressure on the site. °Document Released: 06/22/2010 Document Revised:  08/12/2011 Document Reviewed: 06/22/2010 °[employer]® Patient Information ©2015 [employer]. This information is not intended to replace advice given to you by your health care provider. Make sure you discuss any questions you have with your health care provider. ° °

## 2015-01-09 MED FILL — Verapamil HCl IV Soln 2.5 MG/ML: INTRAVENOUS | Qty: 2 | Status: AC

## 2015-02-11 ENCOUNTER — Ambulatory Visit
Admission: EM | Admit: 2015-02-11 | Discharge: 2015-02-11 | Disposition: A | Discharge status: Home / Self Care | Payer: BLUE CROSS/BLUE SHIELD | Attending: Family Medicine | Admitting: Family Medicine

## 2015-02-11 ENCOUNTER — Ambulatory Visit: Payer: BLUE CROSS/BLUE SHIELD

## 2015-02-11 DIAGNOSIS — Z23 Encounter for immunization: Secondary | ICD-10-CM | POA: Diagnosis not present

## 2015-02-11 DIAGNOSIS — S61012A Laceration without foreign body of left thumb without damage to nail, initial encounter: Secondary | ICD-10-CM

## 2015-02-11 DIAGNOSIS — S61002A Unspecified open wound of left thumb without damage to nail, initial encounter: Secondary | ICD-10-CM

## 2015-02-11 MED ORDER — SULFAMETHOXAZOLE-TRIMETHOPRIM 800-160 MG PO TABS: 1.0000 | ORAL_TABLET | Freq: Two times a day (BID) | ORAL | Status: DC

## 2015-02-11 MED ORDER — MUPIROCIN 2 % EX OINT: 1.0000 "application " | TOPICAL_OINTMENT | Freq: Two times a day (BID) | CUTANEOUS | Status: DC

## 2015-02-11 MED ORDER — TETANUS-DIPHTH-ACELL PERTUSSIS 5-2.5-18.5 LF-MCG/0.5 IM SUSP
0.5000 mL | Freq: Once | INTRAMUSCULAR | Status: AC
  Administered 2015-02-11: 0.5 mL via INTRAMUSCULAR

## 2015-02-11 NOTE — ED Notes (Signed)
Laceration to left hand happened within the hour. Laceration to left thumb. Some numbness and tingling

## 2015-02-11 NOTE — Discharge Instructions (Signed)
Cellulitis °Cellulitis is an infection of the skin and the tissue beneath it. The infected area is usually red and tender. Cellulitis occurs most often in the arms and lower legs.  °CAUSES  °Cellulitis is caused by bacteria that enter the skin through cracks or cuts in the skin. The most common types of bacteria that cause cellulitis are staphylococci and streptococci. °SIGNS AND SYMPTOMS  °· Redness and warmth. °· Swelling. °· Tenderness or pain. °· Fever. °DIAGNOSIS  °Your health care provider can usually determine what is wrong based on a physical exam. Blood tests may also be done. °TREATMENT  °Treatment usually involves taking an antibiotic medicine. °HOME CARE INSTRUCTIONS  °· Take your antibiotic medicine as directed by your health care provider. Finish the antibiotic even if you start to feel better. °· Keep the infected arm or leg elevated to reduce swelling. °· Apply a warm cloth to the affected area up to 4 times per day to relieve pain. °· Take medicines only as directed by your health care provider. °· Keep all follow-up visits as directed by your health care provider. °SEEK MEDICAL CARE IF:  °· You notice red streaks coming from the infected area. °· Your red area gets larger or turns dark in color. °· Your bone or joint underneath the infected area becomes painful after the skin has healed. °· Your infection returns in the same area or another area. °· You notice a swollen bump in the infected area. °· You develop new symptoms. °· You have a fever. °SEEK IMMEDIATE MEDICAL CARE IF:  °· You feel very sleepy. °· You develop vomiting or diarrhea. °· You have a general ill feeling (malaise) with muscle aches and pains. °MAKE SURE YOU:  °· Understand these instructions. °· Will watch your condition. °· Will get help right away if you are not doing well or get worse. °Document Released: 02/27/2005 Document Revised: 10/04/2013 Document Reviewed: 08/05/2011 °[employer]® Patient Information ©2015 [employer].  This information is not intended to replace advice given to you by your health care provider. Make sure you discuss any questions you have with your health care provider. °Sutured Wound Care °Sutures are stitches that can be used to close wounds. Wound care helps prevent pain and infection.  °HOME CARE INSTRUCTIONS  °· Rest and elevate the injured area until all the pain and swelling are gone. °· Only take over-the-counter or prescription medicines for pain, discomfort, or fever as directed by your caregiver. °· After 48 hours, gently wash the area with mild soap and water once a day, or as directed. Rinse off the soap. Pat the area dry with a clean towel. Do not rub the wound. This may cause bleeding. °· Follow your caregiver's instructions for how often to change the bandage (dressing). Stop using a dressing after 2 days or after the wound stops draining. °· If the dressing sticks, moisten it with soapy water and gently remove it. °· Apply ointment on the wound as directed. °· Avoid stretching a sutured wound. °· Drink enough fluids to keep your urine clear or pale yellow. °· Follow up with your caregiver for suture removal as directed. °· Use sunscreen on your wound for the next 3 to 6 months so the scar will not darken. °SEEK IMMEDIATE MEDICAL CARE IF:  °· Your wound becomes red, swollen, hot, or tender. °· You have increasing pain in the wound. °· You have a red streak that extends from the wound. °· There is pus coming from the wound. °·   You have a fever. °· You have shaking chills. °· There is a bad smell coming from the wound. °· You have persistent bleeding from the wound. °MAKE SURE YOU:  °· Understand these instructions. °· Will watch your condition. °· Will get help right away if you are not doing well or get worse. °Document Released: 06/27/2004 Document Revised: 08/12/2011 Document Reviewed: 09/23/2010 °[employer]® Patient Information ©2015 [employer]. This information is not intended to replace  advice given to you by your health care provider. Make sure you discuss any questions you have with your health care provider. °Laceration Care, Adult °A laceration is a cut or lesion that goes through all layers of the skin and into the tissue just beneath the skin. °TREATMENT  °Some lacerations may not require closure. Some lacerations may not be able to be closed due to an increased risk of infection. It is important to see your caregiver as soon as possible after an injury to minimize the risk of infection and maximize the opportunity for successful closure. °If closure is appropriate, pain medicines may be given, if needed. The wound will be cleaned to help prevent infection. Your caregiver will use stitches (sutures), staples, wound glue (adhesive), or skin adhesive strips to repair the laceration. These tools bring the skin edges together to allow for faster healing and a better cosmetic outcome. However, all wounds will heal with a scar. Once the wound has healed, scarring can be minimized by covering the wound with sunscreen during the day for 1 full year. °HOME CARE INSTRUCTIONS  °For sutures or staples: °· Keep the wound clean and dry. °· If you were given a bandage (dressing), you should change it at least once a day. Also, change the dressing if it becomes wet or dirty, or as directed by your caregiver. °· Wash the wound with soap and water 2 times a day. Rinse the wound off with water to remove all soap. Pat the wound dry with a clean towel. °· After cleaning, apply a thin layer of the antibiotic ointment as recommended by your caregiver. This will help prevent infection and keep the dressing from sticking. °· You may shower as usual after the first 24 hours. Do not soak the wound in water until the sutures are removed. °· Only take over-the-counter or prescription medicines for pain, discomfort, or fever as directed by your caregiver. °· Get your sutures or staples removed as directed by your  caregiver. °For skin adhesive strips: °· Keep the wound clean and dry. °· Do not get the skin adhesive strips wet. You may bathe carefully, using caution to keep the wound dry. °· If the wound gets wet, pat it dry with a clean towel. °· Skin adhesive strips will fall off on their own. You may trim the strips as the wound heals. Do not remove skin adhesive strips that are still stuck to the wound. They will fall off in time. °For wound adhesive: °· You may briefly wet your wound in the shower or bath. Do not soak or scrub the wound. Do not swim. Avoid periods of heavy perspiration until the skin adhesive has fallen off on its own. After showering or bathing, gently pat the wound dry with a clean towel. °· Do not apply liquid medicine, cream medicine, or ointment medicine to your wound while the skin adhesive is in place. This may loosen the film before your wound is healed. °· If a dressing is placed over the wound, be careful not to apply   tape directly over the skin adhesive. This may cause the adhesive to be pulled off before the wound is healed. °· Avoid prolonged exposure to sunlight or tanning lamps while the skin adhesive is in place. Exposure to ultraviolet light in the first year will darken the scar. °· The skin adhesive will usually remain in place for 5 to 10 days, then naturally fall off the skin. Do not pick at the adhesive film. °You may need a tetanus shot if: °· You cannot remember when you had your last tetanus shot. °· You have never had a tetanus shot. °If you get a tetanus shot, your arm may swell, get red, and feel warm to the touch. This is common and not a problem. If you need a tetanus shot and you choose not to have one, there is a rare chance of getting tetanus. Sickness from tetanus can be serious. °SEEK MEDICAL CARE IF:  °· You have redness, swelling, or increasing pain in the wound. °· You see a red line that goes away from the wound. °· You have yellowish-white fluid (pus) coming from  the wound. °· You have a fever. °· You notice a bad smell coming from the wound or dressing. °· Your wound breaks open before or after sutures have been removed. °· You notice something coming out of the wound such as wood or glass. °· Your wound is on your hand or foot and you cannot move a finger or toe. °SEEK IMMEDIATE MEDICAL CARE IF:  °· Your pain is not controlled with prescribed medicine. °· You have severe swelling around the wound causing pain and numbness or a change in color in your arm, hand, leg, or foot. °· Your wound splits open and starts bleeding. °· You have worsening numbness, weakness, or loss of function of any joint around or beyond the wound. °· You develop painful lumps near the wound or on the skin anywhere on your body. °MAKE SURE YOU:  °· Understand these instructions. °· Will watch your condition. °· Will get help right away if you are not doing well or get worse. °Document Released: 05/20/2005 Document Revised: 08/12/2011 Document Reviewed: 11/13/2010 °[employer]® Patient Information ©2015 [employer]. This information is not intended to replace advice given to you by your health care provider. Make sure you discuss any questions you have with your health care provider. ° °

## 2015-02-12 NOTE — ED Provider Notes (Signed)
CSN: [id number]     Arrival date & time 02/11/15  1135 History   First MD Initiated Contact with Patient 02/11/15 1208     Chief Complaint  Patient presents with  . Extremity Laceration   (Consider location/radiation/quality/duration/timing/severity/associated sxs/prior Treatment) HPI Comments: Single african [place] male was replacing carpet and carpet knife slashed his left thumb.  Here for evaluation and sutures.    Patient reported bled quite a bit but stopped with holding pressure.  Painful.  Unsure of last tetanus vaccination date.    The history is provided by the patient.    Past Medical History  Diagnosis Date  . Hypertension    Past Surgical History  Procedure Laterality Date  . Ankle surgery    . Head surgery      laceration secondary to trauma  . Rib fractures    . Cardiac catheterization N/A 01/06/2015    Procedure: Left Heart Cath and Coronary Angiography;  Surgeon: Yates Decamp, MD;  Location: [facility] INVASIVE CV LAB;  Service: Cardiovascular;  Laterality: N/A;   History reviewed. No pertinent family history. Social History  Substance Use Topics  . Smoking status: Never Smoker   . Smokeless tobacco: None  . Alcohol Use: Yes    Review of Systems  Constitutional: Negative for fever, chills, diaphoresis, activity change, appetite change, fatigue and unexpected weight change.  HENT: Negative for congestion, dental problem, drooling, ear discharge, ear pain, facial swelling, trouble swallowing and voice change.   Eyes: Negative for photophobia, pain, discharge, redness, itching and visual disturbance.  Respiratory: Negative for cough, choking, shortness of breath, wheezing and stridor.   Cardiovascular: Negative for chest pain and leg swelling.  Gastrointestinal: Negative for nausea, vomiting, abdominal pain, diarrhea, constipation, blood in stool, abdominal distention and rectal pain.  Endocrine: Negative for cold intolerance and heat intolerance.  Genitourinary: Negative  for dysuria.  Musculoskeletal: Positive for myalgias. Negative for back pain, joint swelling, arthralgias, gait problem, neck pain and neck stiffness.  Skin: Positive for wound. Negative for color change, pallor and rash.  Allergic/Immunologic: Negative for environmental allergies and food allergies.  Neurological: Negative for dizziness, tremors, seizures, syncope, facial asymmetry, speech difficulty, weakness, light-headedness, numbness and headaches.  Hematological: Negative for adenopathy. Does not bruise/bleed easily.  Psychiatric/Behavioral: Negative for behavioral problems, confusion, sleep disturbance and agitation.    Allergies  Morphine and related  Home Medications   Prior to Admission medications   Medication Sig Start Date End Date Taking? Authorizing Provider  acetaminophen (TYLENOL) 500 MG tablet Take 500 mg by mouth every 6 (six) hours as needed for mild pain or headache.    Historical Provider, MD  amLODipine (NORVASC) 10 MG tablet Take 10 mg by mouth daily. 12/24/14   Historical Provider, MD  aspirin EC 81 MG tablet Take 162 mg by mouth daily.    Historical Provider, MD  atorvastatin (LIPITOR) 40 MG tablet Take 40 mg by mouth at bedtime. 12/25/14   Historical Provider, MD  DUEXIS 800-26.6 MG TABS Take 1 tablet by mouth 3 (three) times daily. 11/26/14   Historical Provider, MD  losartan (COZAAR) 100 MG tablet Take 100 mg by mouth daily. for blood pressure 12/24/14   Historical Provider, MD  metoprolol tartrate (LOPRESSOR) 25 MG tablet Take 25 mg by mouth 2 (two) times daily. 12/21/14   Historical Provider, MD  mupirocin ointment (BACTROBAN) 2 % Apply 1 application topically 2 (two) times daily. Left thumb 02/11/15   Barbaraann Barthel, NP  NITROSTAT 0.4 MG SL tablet Place  0.4 mg under the tongue every 5 (five) minutes as needed for chest pain.  12/21/14   Historical Provider, MD  sulfamethoxazole-trimethoprim (BACTRIM DS,SEPTRA DS) 800-160 MG per tablet Take 1 tablet by mouth 2  (two) times daily. 02/11/15   Barbaraann Barthel, NP   Meds Ordered and Administered this Visit   Medications  Tdap (BOOSTRIX) injection 0.5 mL (0.5 mLs Intramuscular Given 02/11/15 1204)    BP 127/100 mmHg  Pulse 70  Temp(Src) 98 F (36.7 C) (Oral)  Ht 6\' 2"  (1.88 m)  Wt 220 lb (99.791 kg)  BMI 28.23 kg/m2  SpO2 99% No data found.   Physical Exam  Constitutional: He is oriented to person, place, and time. Vital signs are normal. He appears well-developed and well-nourished. No distress.  HENT:  Head: Normocephalic and atraumatic.  Right Ear: External ear normal.  Left Ear: External ear normal.  Nose: Nose normal.  Mouth/Throat: Oropharynx is clear and moist. No oropharyngeal exudate.  Eyes: Conjunctivae, EOM and lids are normal. Pupils are equal, round, and reactive to light. Right eye exhibits no discharge. Left eye exhibits no discharge. No scleral icterus.  Neck: Trachea normal and normal range of motion. Neck supple. No tracheal deviation present.  Cardiovascular: Normal rate, regular rhythm, normal heart sounds and intact distal pulses.  Exam reveals no gallop and no friction rub.   No murmur heard. Pulmonary/Chest: Effort normal and breath sounds normal. No stridor. No respiratory distress. He has no wheezes. He has no rales.  Abdominal: Soft. He exhibits no distension.  Musculoskeletal: Normal range of motion. He exhibits edema and tenderness.       Right shoulder: Normal.       Left shoulder: Normal.       Right elbow: Normal.      Left elbow: Normal.       Right wrist: Normal.       Left wrist: Normal.       Right forearm: Normal.       Left forearm: Normal.       Right hand: Normal.       Left hand: He exhibits tenderness, laceration and swelling. He exhibits normal range of motion, no bony tenderness, normal two-point discrimination, normal capillary refill and no deformity. Normal sensation noted. Normal strength noted.       Hands: Neurological: He is alert and  oriented to person, place, and time. He is not disoriented. He displays no atrophy and no tremor. No cranial nerve deficit or sensory deficit. He exhibits normal muscle tone. He displays no seizure activity. Coordination and gait normal. GCS eye subscore is 4. GCS verbal subscore is 5. GCS motor subscore is 6.  Skin: Skin is warm and dry. Laceration noted. No abrasion, no bruising, no burn, no ecchymosis, no lesion, no petechiae and no rash noted. He is not diaphoretic. There is erythema. No cyanosis. No pallor. Nails show no clubbing.     1.5cm laceration left anterior thumb  Psychiatric: He has a normal mood and affect. His speech is normal and behavior is normal. Judgment and thought content normal. Cognition and memory are normal.  Nursing note and vitals reviewed.   ED Course  LACERATION REPAIR Date/Time: 02/11/2015 1:00 PM Performed by: Albina Billet A Authorized by: Hassan Rowan Consent: Verbal consent obtained. Written consent not obtained. Risks and benefits: risks, benefits and alternatives were discussed Consent given by: patient Patient understanding: patient states understanding of the procedure being performed Patient consent: the patient's understanding of the procedure  matches consent given Test results: test results available and properly labeled Site marked: the operative site was marked Imaging studies: imaging studies available Patient identity confirmed: verbally with patient and arm band Time out: Immediately prior to procedure a "time out" was called to verify the correct patient, procedure, equipment, support staff and site/side marked as required. Body area: upper extremity Location details: left thumb Laceration length: 1.5 cm Contamination: The wound is contaminated. Foreign bodies: unknown Tendon involvement: none Nerve involvement: none Vascular damage: no Anesthesia: local infiltration Local anesthetic: lidocaine 1% without epinephrine Anesthetic  total: 5 ml Patient sedated: no Preparation: Patient was prepped and draped in the usual sterile fashion. Irrigation solution: saline Irrigation method: syringe Amount of cleaning: standard Debridement: none Degree of undermining: none Skin closure: 5-0 nylon Number of sutures: 5 Technique: simple Approximation: close Approximation difficulty: simple Dressing: gauze roll and antibiotic ointment Patient tolerance: Patient tolerated the procedure well with no immediate complications   (including critical care time)  Labs Review Labs Reviewed - No data to display  Imaging Review Study Result     CLINICAL DATA: Cut thumb cutting carpet.  EXAM: LEFT THUMB 2+V  COMPARISON: None.  FINDINGS: There is no evidence of fracture or dislocation. There is no evidence of arthropathy or other focal bone abnormality. There is a soft tissue laceration involving the volar aspect of the thumb.  IMPRESSION: 1. No acute bone abnormality. 2. Soft tissue laceration.   Electronically Signed  By: Signa Kell M.D.  On: 02/11/2015 12:50   Medications  Tdap (BOOSTRIX) injection 0.5 mL (0.5 mLs Intramuscular Given 02/11/15 1204)  given by RN Arthor Captain  1300 discussed xray result negative for fracture or foreign body with patient and given copy of radiology report.  Laceration irrigated with saline and hibiclens then lidocaine 1% infiltrated 5ml into and surrounding soft tissue.  Less than 5cc blood loss estimated.  Triple antibiotic cream applied after sutures placed and protective dressing applied gauze with cobain.  Patient to change prn.  Shower affected area daily let soapy water run over area then apply bactroban and cover with bandaid or gauze to protect from contamination.  Patient verbalized understanding of information/instructions, agreed with plan of care and had no further questions at this time.   MDM   1. Laceration of thumb, left, initial encounter    Patient  was instructed to rest, ice and elevate left hand.patient did not want any narcotics reported has problem with them in the past.  Do not soak hand until laceration healed avoid pool, lake, hot tub, dirty sink water.  Exitcare handout on suture care, laceration given to patient.   Medications as directed.  Bactrim DS po BID and bactroban topical BID x 7 days.  Follow up in 10 to 14 days for suture removal.  Monitor for purulent drainage, erythema, worsening pain and follow up if these occur for re-evaluation.  Tylenol 100mg  po QID prn pain.  Discussed with patient motrin and naproxen would counteract his blood pressure medications..  Tetanus booster given today.  Call or return to clinic as needed if these symptoms worsen or fail to improve as anticipated and will consider wrist splint and orthopedics evaluation.  Patient verbalized agreement and understanding of treatment plan.  P2:  ROM, injury prevention   Barbaraann Barthel, NP 02/12/15 2037

## 2015-02-27 ENCOUNTER — Ambulatory Visit
Admission: RE | Admit: 2015-02-27 | Discharge: 2015-02-27 | Disposition: A | Discharge status: Home / Self Care | Payer: BLUE CROSS/BLUE SHIELD | Source: Ambulatory Visit | Attending: Family Medicine | Admitting: Family Medicine

## 2015-02-27 ENCOUNTER — Other Ambulatory Visit: Payer: Self-pay | Admitting: Family Medicine

## 2015-02-27 DIAGNOSIS — R52 Pain, unspecified: Secondary | ICD-10-CM

## 2015-09-24 ENCOUNTER — Encounter: Payer: Self-pay | Admitting: *Deleted

## 2015-09-24 ENCOUNTER — Emergency Department
Admission: EM | Admit: 2015-09-24 | Discharge: 2015-09-24 | Disposition: A | Discharge status: Home / Self Care | Payer: BLUE CROSS/BLUE SHIELD | Attending: Emergency Medicine | Admitting: Emergency Medicine

## 2015-09-24 DIAGNOSIS — Z79899 Other long term (current) drug therapy: Secondary | ICD-10-CM | POA: Insufficient documentation

## 2015-09-24 DIAGNOSIS — S71111A Laceration without foreign body, right thigh, initial encounter: Secondary | ICD-10-CM

## 2015-09-24 DIAGNOSIS — Y9389 Activity, other specified: Secondary | ICD-10-CM | POA: Insufficient documentation

## 2015-09-24 DIAGNOSIS — I10 Essential (primary) hypertension: Secondary | ICD-10-CM | POA: Insufficient documentation

## 2015-09-24 DIAGNOSIS — Y92009 Unspecified place in unspecified non-institutional (private) residence as the place of occurrence of the external cause: Secondary | ICD-10-CM | POA: Insufficient documentation

## 2015-09-24 DIAGNOSIS — Y999 Unspecified external cause status: Secondary | ICD-10-CM | POA: Insufficient documentation

## 2015-09-24 DIAGNOSIS — W268XXA Contact with other sharp object(s), not elsewhere classified, initial encounter: Secondary | ICD-10-CM | POA: Insufficient documentation

## 2015-09-24 DIAGNOSIS — Z7982 Long term (current) use of aspirin: Secondary | ICD-10-CM | POA: Insufficient documentation

## 2015-09-24 MED ORDER — CEPHALEXIN 500 MG PO CAPS
500.0000 mg | ORAL_CAPSULE | Freq: Once | ORAL | Status: AC
  Administered 2015-09-24: 500 mg via ORAL
  Filled 2015-09-24: qty 1

## 2015-09-24 MED ORDER — CEPHALEXIN 500 MG PO CAPS: 500.0000 mg | ORAL_CAPSULE | Freq: Three times a day (TID) | ORAL | Status: DC

## 2015-09-24 MED ORDER — LIDOCAINE HCL (PF) 1 % IJ SOLN
5.0000 mL | Freq: Once | INTRAMUSCULAR | Status: AC
  Administered 2015-09-24: 5 mL
  Filled 2015-09-24: qty 5

## 2015-09-24 NOTE — Discharge Instructions (Signed)
Laceration Care, Adult  A laceration is a cut that goes through all layers of the skin. The cut also goes into the tissue that is right under the skin. Some cuts heal on their own. Others need to be closed with stitches (sutures), staples, skin adhesive strips, or wound glue. Taking care of your cut lowers your risk of infection and helps your cut to heal better.  HOW TO TAKE CARE OF YOUR CUT  For stitches or staples:  · Keep the wound clean and dry.  · If you were given a bandage (dressing), you should change it at least one time per day or as told by your doctor. You should also change it if it gets wet or dirty.  · Keep the wound completely dry for the first 24 hours or as told by your doctor. After that time, you may take a shower or a bath. However, make sure that the wound is not soaked in water until after the stitches or staples have been removed.  · Clean the wound one time each day or as told by your doctor:    Wash the wound with soap and water.    Rinse the wound with water until all of the soap comes off.    Pat the wound dry with a clean towel. Do not rub the wound.  · After you clean the wound, put a thin layer of antibiotic ointment on it as told by your doctor. This ointment:    Helps to prevent infection.    Keeps the bandage from sticking to the wound.  · Have your stitches or staples removed as told by your doctor.  If your doctor used skin adhesive strips:   · Keep the wound clean and dry.  · If you were given a bandage, you should change it at least one time per day or as told by your doctor. You should also change it if it gets dirty or wet.  · Do not get the skin adhesive strips wet. You can take a shower or a bath, but be careful to keep the wound dry.  · If the wound gets wet, pat it dry with a clean towel. Do not rub the wound.  · Skin adhesive strips fall off on their own. You can trim the strips as the wound heals. Do not remove any strips that are still stuck to the wound. They will  fall off after a while.  If your doctor used wound glue:  · Try to keep your wound dry, but you may briefly wet it in the shower or bath. Do not soak the wound in water, such as by swimming.  · After you take a shower or a bath, gently pat the wound dry with a clean towel. Do not rub the wound.  · Do not do any activities that will make you really sweaty until the skin glue has fallen off on its own.  · Do not apply liquid, cream, or ointment medicine to your wound while the skin glue is still on.  · If you were given a bandage, you should change it at least one time per day or as told by your doctor. You should also change it if it gets dirty or wet.  · If a bandage is placed over the wound, do not let the tape for the bandage touch the skin glue.  · Do not pick at the glue. The skin glue usually stays on for 5-10 days. Then, it   or when wound glue stays in place and the wound is healed. Make sure to wear a sunscreen of at least 30 SPF.  Take over-the-counter and prescription medicines only as told by your doctor.  If you were given antibiotic medicine or ointment, take or apply it as told by your doctor. Do not stop using the antibiotic even if your wound is getting better.  Do not scratch or pick at the wound.  Keep all follow-up visits as told by your doctor. This is important.  Check your wound every day for signs of infection. Watch for:  Redness, swelling, or pain.  Fluid, blood, or pus.  Raise (elevate) the injured area above the level of your heart while you are sitting or lying down, if possible. GET HELP IF:  You got a tetanus shot and you have any of these problems at the injection site:  Swelling.  Very bad pain.  Redness.  Bleeding.  You have a fever.  A wound that was  closed breaks open.  You notice a bad smell coming from your wound or your bandage.  You notice something coming out of the wound, such as wood or glass.  Medicine does not help your pain.  You have more redness, swelling, or pain at the site of your wound.  You have fluid, blood, or pus coming from your wound.  You notice a change in the color of your skin near your wound.  You need to change the bandage often because fluid, blood, or pus is coming from the wound.  You start to have a new rash.  You start to have numbness around the wound. GET HELP RIGHT AWAY IF:  You have very bad swelling around the wound.  Your pain suddenly gets worse and is very bad.  You notice painful lumps near the wound or on skin that is anywhere on your body.  You have a red streak going away from your wound.  The wound is on your hand or foot and you cannot move a finger or toe like you usually can.  The wound is on your hand or foot and you notice that your fingers or toes look pale or bluish.   This information is not intended to replace advice given to you by your health care provider. Make sure you discuss any questions you have with your health care provider.   Document Released: 11/06/2007 Document Revised: 10/04/2014 Document Reviewed: 05/16/2014 Elsevier Interactive Patient Education 2016 [employer]   WOUND CARE Please return in 10 days to have your stitches/staples removed or sooner if you have concerns.  Keep area clean and dry for 24 hours. Do not remove bandage, if applied.  After 24 hours, remove bandage and wash wound gently with mild soap and warm water. Reapply a new bandage after cleaning wound, if directed.  Continue daily cleansing with soap and water until stitches/staples are removed.  Do not apply any ointments or creams to the wound while stitches/staples are in place, as this may cause delayed healing.  Notify the office if you experience any of the  following signs of infection: Swelling, redness, pus drainage, streaking, fever >101.0 F  Notify the office if you experience excessive bleeding that does not stop after 15-20 minutes of constant, firm pressure.

## 2015-09-24 NOTE — ED Provider Notes (Signed)
[facility] Emergency Department Provider Note  ____________________________________________  Time seen: Approximately 9:24 PM  I have reviewed the triage vital signs and the nursing notes.   HISTORY  Chief Complaint Leg Injury   HPI Benjamin [name] is a 50 y.o. male is here tonight with complaint of laceration to his right leg. Patient states he cut it on a box cutter approximately 12 hours ago. He states that he had chores to do around the house and decided to come in later tonight. Patient is up-to-date on immunizations.   Past Medical History  Diagnosis Date  . Hypertension     Patient Active Problem List   Diagnosis Date Noted  . Angina pectoris (HCC) 01/04/2015    Past Surgical History  Procedure Laterality Date  . Ankle surgery    . Head surgery      laceration secondary to trauma  . Rib fractures    . Cardiac catheterization N/A 01/06/2015    Procedure: Left Heart Cath and Coronary Angiography;  Surgeon: Yates Decamp, MD;  Location: [facility] INVASIVE CV LAB;  Service: Cardiovascular;  Laterality: N/A;    Current Outpatient Rx  Name  Route  Sig  Dispense  Refill  . acetaminophen (TYLENOL) 500 MG tablet   Oral   Take 500 mg by mouth every 6 (six) hours as needed for mild pain or headache.         Marland Kitchen amLODipine (NORVASC) 10 MG tablet   Oral   Take 10 mg by mouth daily.      3   . aspirin EC 81 MG tablet   Oral   Take 162 mg by mouth daily.         Marland Kitchen atorvastatin (LIPITOR) 40 MG tablet   Oral   Take 40 mg by mouth at bedtime.      3   . cephALEXin (KEFLEX) 500 MG capsule   Oral   Take 1 capsule (500 mg total) by mouth 3 (three) times daily.   21 capsule   0   . DUEXIS 800-26.6 MG TABS   Oral   Take 1 tablet by mouth 3 (three) times daily.      3     Dispense as written.   Marland Kitchen losartan (COZAAR) 100 MG tablet   Oral   Take 100 mg by mouth daily. for blood pressure      3   . metoprolol tartrate (LOPRESSOR) 25 MG tablet   Oral   Take 25 mg by mouth 2 (two) times daily.      3   . NITROSTAT 0.4 MG SL tablet   Sublingual   Place 0.4 mg under the tongue every 5 (five) minutes as needed for chest pain.       0     Dispense as written.     Allergies Morphine and related  No family history on file.  Social History Social History  Substance Use Topics  . Smoking status: Never Smoker   . Smokeless tobacco: None  . Alcohol Use: Yes    Review of Systems Constitutional: No fever/chills Cardiovascular: Denies chest pain. Respiratory: Denies shortness of breath. Gastrointestinal:   No nausea, no vomiting. Musculoskeletal: Negative for back pain. Skin: Positive for laceration Neurological: Negative for headaches, focal weakness or numbness.  10-point ROS otherwise negative.  ____________________________________________   PHYSICAL EXAM:  VITAL SIGNS: ED Triage Vitals  Enc Vitals Group     BP 09/24/15 2059 145/85 mmHg     Pulse  Rate 09/24/15 2059 72     Resp 09/24/15 2059 16     Temp 09/24/15 2059 98.4 F (36.9 C)     Temp Source 09/24/15 2059 Oral     SpO2 09/24/15 2059 95 %     Weight 09/24/15 2059 220 lb (99.791 kg)     Height 09/24/15 2059 6' (1.829 m)     Head Cir --      Peak Flow --      Pain Score 09/24/15 2104 3     Pain Loc --      Pain Edu? --      Excl. in GC? --     Constitutional: Alert and oriented. Well appearing and in no acute distress. Eyes: Conjunctivae are normal. PERRL. EOMI. Head: Atraumatic. Nose: No congestion/rhinnorhea. Neck: No stridor.   Cardiovascular: Normal rate, regular rhythm. Grossly normal heart sounds.  Good peripheral circulation. Respiratory: Normal respiratory effort.  No retractions. Lungs CTAB. Musculoskeletal: Moves upper and lower extremities without any difficulty. Normal gait was noted. Neurologic:  Normal speech and language. No gross focal neurologic deficits are appreciated. No gait instability. Skin:  Skin is warm, dry.  Laceration to the right lateral aspect of the lower thigh. No active bleeding is present. No foreign body is noted. Psychiatric: Mood and affect are normal. Speech and behavior are normal.  ____________________________________________   LABS (all labs ordered are listed, but only abnormal results are displayed)  Labs Reviewed - No data to display   PROCEDURES  Procedure(s) performed: LACERATION REPAIR Performed by: Tommi Rumpshonda L Summers Authorized by: Tommi Rumpshonda L Summers Consent: Verbal consent obtained. Risks and benefits: risks, benefits and alternatives were discussed Consent given by: patient Patient identity confirmed: provided demographic data Prepped and Draped in normal sterile fashion Wound explored  Laceration Location: Right lateral distal thigh  Laceration Length: 2.5 cm  No Foreign Bodies seen or palpated  Anesthesia: local infiltration  Local anesthetic: lidocaine 1 % without epinephrine  Anesthetic total: 4 ml  Irrigation method: syringe Amount of cleaning: standard  Skin closure: 4-0 Ethilon   Number of sutures: 4   Technique: Simple interrupted   Patient tolerance: Patient tolerated the procedure well with no immediate complications.  Critical Care performed: No  ____________________________________________   INITIAL IMPRESSION / ASSESSMENT AND PLAN / ED COURSE  Pertinent labs & imaging results that were available during my care of the patient were reviewed by me and considered in my medical decision making (see chart for details).  Patient is placed on Keflex 500 mg 3 times a day for infection prevention as the laceration has been open for almost 12 hours now. There was no foreign body noted and area was cleaned frequently prior to suturing. [facility] or his family doctor. ____________________________________________   FINAL CLINICAL IMPRESSION(S) / ED DIAGNOSES  Final diagnoses:  Laceration  of right thigh without complication, initial encounter      Tommi RumpsRhonda L Summers, PA-C 09/24/15 2204  Sharman CheekPhillip Stafford, MD 09/25/15 0021

## 2015-09-24 NOTE — ED Notes (Signed)
Patient states he cut his right leg with a box cutter at 10:00am this morning while doing chores at the house. Patient states he bandaged the leg and applied pressure until the bleeding stopped, but decided to come in for sutures in case it broke back open.

## 2015-09-24 NOTE — ED Notes (Signed)
Patient reports that he cut his leg with a box cutter. Patient with about a 1 inch laceration to right upper leg. Bleeding controlled at this time.

## 2015-12-12 ENCOUNTER — Other Ambulatory Visit: Payer: Self-pay | Admitting: Orthopedic Surgery

## 2016-01-02 ENCOUNTER — Encounter (HOSPITAL_COMMUNITY): Payer: Self-pay | Admitting: Anesthesiology

## 2016-01-03 ENCOUNTER — Encounter (HOSPITAL_BASED_OUTPATIENT_CLINIC_OR_DEPARTMENT_OTHER): Payer: Self-pay | Admitting: *Deleted

## 2016-01-08 ENCOUNTER — Ambulatory Visit (HOSPITAL_BASED_OUTPATIENT_CLINIC_OR_DEPARTMENT_OTHER): Payer: BLUE CROSS/BLUE SHIELD | Admitting: Anesthesiology

## 2016-01-08 ENCOUNTER — Encounter (HOSPITAL_BASED_OUTPATIENT_CLINIC_OR_DEPARTMENT_OTHER)
Admission: RE | Disposition: A | Discharge status: Home / Self Care | Payer: Self-pay | Source: Ambulatory Visit | Attending: Orthopedic Surgery

## 2016-01-08 ENCOUNTER — Ambulatory Visit (HOSPITAL_BASED_OUTPATIENT_CLINIC_OR_DEPARTMENT_OTHER)
Admission: RE | Admit: 2016-01-08 | Discharge: 2016-01-08 | Disposition: A | Discharge status: Home / Self Care | Payer: BLUE CROSS/BLUE SHIELD | Source: Ambulatory Visit | Attending: Orthopedic Surgery | Admitting: Orthopedic Surgery

## 2016-01-08 ENCOUNTER — Encounter (HOSPITAL_BASED_OUTPATIENT_CLINIC_OR_DEPARTMENT_OTHER): Payer: Self-pay | Admitting: *Deleted

## 2016-01-08 DIAGNOSIS — I10 Essential (primary) hypertension: Secondary | ICD-10-CM | POA: Diagnosis not present

## 2016-01-08 DIAGNOSIS — Z79899 Other long term (current) drug therapy: Secondary | ICD-10-CM | POA: Diagnosis not present

## 2016-01-08 DIAGNOSIS — M75101 Unspecified rotator cuff tear or rupture of right shoulder, not specified as traumatic: Secondary | ICD-10-CM | POA: Diagnosis present

## 2016-01-08 DIAGNOSIS — Z7982 Long term (current) use of aspirin: Secondary | ICD-10-CM | POA: Diagnosis not present

## 2016-01-08 HISTORY — PX: SHOULDER ARTHROSCOPY WITH ROTATOR CUFF REPAIR: SHX5685

## 2016-01-08 SURGERY — ARTHROSCOPY, SHOULDER, WITH ROTATOR CUFF REPAIR
Anesthesia: General | Site: Shoulder | Laterality: Right

## 2016-01-08 MED ORDER — SODIUM CHLORIDE 0.9 % IR SOLN
Status: DC | PRN
  Administered 2016-01-08: 4000 mL

## 2016-01-08 MED ORDER — POVIDONE-IODINE 7.5 % EX SOLN: Freq: Once | CUTANEOUS | Status: DC

## 2016-01-08 MED ORDER — PROMETHAZINE HCL 25 MG/ML IJ SOLN: 6.2500 mg | INTRAMUSCULAR | Status: DC | PRN

## 2016-01-08 MED ORDER — CEFAZOLIN SODIUM-DEXTROSE 2-4 GM/100ML-% IV SOLN
2.0000 g | INTRAVENOUS | Status: AC
  Administered 2016-01-08: 2 g via INTRAVENOUS

## 2016-01-08 MED ORDER — PROPOFOL 10 MG/ML IV BOLUS
INTRAVENOUS | Status: DC | PRN
  Administered 2016-01-08: 200 mg via INTRAVENOUS

## 2016-01-08 MED ORDER — GLYCOPYRROLATE 0.2 MG/ML IJ SOLN: 0.2000 mg | Freq: Once | INTRAMUSCULAR | Status: DC | PRN

## 2016-01-08 MED ORDER — OXYCODONE-ACETAMINOPHEN 5-325 MG PO TABS: 1.0000 | ORAL_TABLET | ORAL | 0 refills | Status: DC | PRN

## 2016-01-08 MED ORDER — LACTATED RINGERS IV SOLN
INTRAVENOUS | Status: DC
  Administered 2016-01-08 (×2): via INTRAVENOUS

## 2016-01-08 MED ORDER — FENTANYL CITRATE (PF) 100 MCG/2ML IJ SOLN
INTRAMUSCULAR | Status: AC
  Filled 2016-01-08: qty 2

## 2016-01-08 MED ORDER — BUPIVACAINE-EPINEPHRINE (PF) 0.5% -1:200000 IJ SOLN
INTRAMUSCULAR | Status: DC | PRN
  Administered 2016-01-08: 30 mL via PERINEURAL

## 2016-01-08 MED ORDER — MIDAZOLAM HCL 2 MG/2ML IJ SOLN
INTRAMUSCULAR | Status: AC
  Filled 2016-01-08: qty 2

## 2016-01-08 MED ORDER — FENTANYL CITRATE (PF) 100 MCG/2ML IJ SOLN: 25.0000 ug | INTRAMUSCULAR | Status: DC | PRN

## 2016-01-08 MED ORDER — CEFAZOLIN SODIUM-DEXTROSE 2-4 GM/100ML-% IV SOLN
INTRAVENOUS | Status: AC
  Filled 2016-01-08: qty 100

## 2016-01-08 MED ORDER — LIDOCAINE HCL (CARDIAC) 20 MG/ML IV SOLN
INTRAVENOUS | Status: DC | PRN
  Administered 2016-01-08: 50 mg via INTRAVENOUS

## 2016-01-08 MED ORDER — MIDAZOLAM HCL 2 MG/2ML IJ SOLN
1.0000 mg | INTRAMUSCULAR | Status: DC | PRN
  Administered 2016-01-08: 2 mg via INTRAVENOUS

## 2016-01-08 MED ORDER — PROPOFOL 10 MG/ML IV BOLUS
INTRAVENOUS | Status: AC
  Filled 2016-01-08: qty 20

## 2016-01-08 MED ORDER — EPHEDRINE SULFATE 50 MG/ML IJ SOLN
INTRAMUSCULAR | Status: DC | PRN
  Administered 2016-01-08: 15 mg via INTRAVENOUS
  Administered 2016-01-08: 10 mg via INTRAVENOUS

## 2016-01-08 MED ORDER — DEXAMETHASONE SODIUM PHOSPHATE 4 MG/ML IJ SOLN
INTRAMUSCULAR | Status: DC | PRN
  Administered 2016-01-08: 10 mg via INTRAVENOUS

## 2016-01-08 MED ORDER — SCOPOLAMINE 1 MG/3DAYS TD PT72: 1.0000 | MEDICATED_PATCH | Freq: Once | TRANSDERMAL | Status: DC | PRN

## 2016-01-08 MED ORDER — FENTANYL CITRATE (PF) 100 MCG/2ML IJ SOLN
50.0000 ug | INTRAMUSCULAR | Status: DC | PRN
  Administered 2016-01-08 (×2): 50 ug via INTRAVENOUS

## 2016-01-08 MED ORDER — DOCUSATE SODIUM 100 MG PO CAPS: 100.0000 mg | ORAL_CAPSULE | Freq: Three times a day (TID) | ORAL | 0 refills | Status: AC | PRN

## 2016-01-08 MED ORDER — SUCCINYLCHOLINE CHLORIDE 20 MG/ML IJ SOLN
INTRAMUSCULAR | Status: DC | PRN
  Administered 2016-01-08: 200 mg via INTRAVENOUS

## 2016-01-08 SURGICAL SUPPLY — 85 items
ANCH SUT KNTLS STRL SHLDR SYS (Anchor) ×1 IMPLANT
ANCHOR SUT QUATTRO KNTLS 4.5 (Anchor) ×2 IMPLANT
APL SKNCLS STERI-STRIP NONHPOA (GAUZE/BANDAGES/DRESSINGS)
BENZOIN TINCTURE PRP APPL 2/3 (GAUZE/BANDAGES/DRESSINGS) IMPLANT
BLADE CLIPPER SURG (BLADE) IMPLANT
BLADE SURG 15 STRL LF DISP TIS (BLADE) IMPLANT
BLADE SURG 15 STRL SS (BLADE)
BUR OVAL 4.0 (BURR) ×3 IMPLANT
CANNULA 5.75X71 LONG (CANNULA) ×3 IMPLANT
CANNULA TWIST IN 8.25X7CM (CANNULA) ×2 IMPLANT
CHLORAPREP W/TINT 26ML (MISCELLANEOUS) ×3 IMPLANT
CLOSURE WOUND 1/2 X4 (GAUZE/BANDAGES/DRESSINGS)
DECANTER SPIKE VIAL GLASS SM (MISCELLANEOUS) IMPLANT
DRAPE IMP U-DRAPE 54X76 (DRAPES) ×3 IMPLANT
DRAPE INCISE IOBAN 66X45 STRL (DRAPES) ×3 IMPLANT
DRAPE STERI 35X30 U-POUCH (DRAPES) ×3 IMPLANT
DRAPE SURG 17X23 STRL (DRAPES) ×3 IMPLANT
DRAPE U-SHAPE 47X51 STRL (DRAPES) ×3 IMPLANT
DRAPE U-SHAPE 76X120 STRL (DRAPES) ×6 IMPLANT
DRSG PAD ABDOMINAL 8X10 ST (GAUZE/BANDAGES/DRESSINGS) ×3 IMPLANT
ELECT REM PT RETURN 9FT ADLT (ELECTROSURGICAL) ×3
ELECTRODE REM PT RTRN 9FT ADLT (ELECTROSURGICAL) ×1 IMPLANT
GAUZE SPONGE 4X4 12PLY STRL (GAUZE/BANDAGES/DRESSINGS) ×3 IMPLANT
GAUZE SPONGE 4X4 16PLY XRAY LF (GAUZE/BANDAGES/DRESSINGS) IMPLANT
GAUZE XEROFORM 1X8 LF (GAUZE/BANDAGES/DRESSINGS) ×3 IMPLANT
GLOVE BIO SURGEON STRL SZ7 (GLOVE) ×3 IMPLANT
GLOVE BIO SURGEON STRL SZ7.5 (GLOVE) ×3 IMPLANT
GLOVE BIOGEL PI IND STRL 7.0 (GLOVE) ×1 IMPLANT
GLOVE BIOGEL PI IND STRL 8 (GLOVE) ×1 IMPLANT
GLOVE BIOGEL PI INDICATOR 7.0 (GLOVE) ×2
GLOVE BIOGEL PI INDICATOR 8 (GLOVE) ×2
GLOVE ECLIPSE 6.5 STRL STRAW (GLOVE) ×2 IMPLANT
GOWN STRL REUS W/ TWL LRG LVL3 (GOWN DISPOSABLE) ×2 IMPLANT
GOWN STRL REUS W/ TWL XL LVL3 (GOWN DISPOSABLE) ×1 IMPLANT
GOWN STRL REUS W/TWL LRG LVL3 (GOWN DISPOSABLE) ×6
GOWN STRL REUS W/TWL XL LVL3 (GOWN DISPOSABLE) ×3
IV NS IRRIG 3000ML ARTHROMATIC (IV SOLUTION) ×4 IMPLANT
LASSO CRESCENT QUICKPASS (SUTURE) ×2 IMPLANT
LIQUID BAND (GAUZE/BANDAGES/DRESSINGS) IMPLANT
MANIFOLD NEPTUNE II (INSTRUMENTS) ×3 IMPLANT
NDL 1/2 CIR CATGUT .05X1.09 (NEEDLE) IMPLANT
NDL SCORPION MULTI FIRE (NEEDLE) IMPLANT
NDL SUT 6 .5 CRC .975X.05 MAYO (NEEDLE) IMPLANT
NEEDLE 1/2 CIR CATGUT .05X1.09 (NEEDLE) IMPLANT
NEEDLE MAYO TAPER (NEEDLE)
NEEDLE SCORPION MULTI FIRE (NEEDLE) IMPLANT
NS IRRIG 1000ML POUR BTL (IV SOLUTION) IMPLANT
PACK ARTHROSCOPY DSU (CUSTOM PROCEDURE TRAY) ×3 IMPLANT
PACK BASIN DAY SURGERY FS (CUSTOM PROCEDURE TRAY) ×3 IMPLANT
PENCIL BUTTON HOLSTER BLD 10FT (ELECTRODE) IMPLANT
RESECTOR FULL RADIUS 4.2MM (BLADE) ×3 IMPLANT
SLEEVE SCD COMPRESS KNEE MED (MISCELLANEOUS) ×3 IMPLANT
SLING ARM FOAM STRAP LRG (SOFTGOODS) IMPLANT
SLING ARM IMMOBILIZER MED (SOFTGOODS) IMPLANT
SLING ARM IMMOBILIZER XL (CAST SUPPLIES) ×2 IMPLANT
SLING ARM MED ADULT FOAM STRAP (SOFTGOODS) IMPLANT
SLING ARM XL FOAM STRAP (SOFTGOODS) IMPLANT
SPONGE LAP 4X18 X RAY DECT (DISPOSABLE) IMPLANT
STRIP CLOSURE SKIN 1/2X4 (GAUZE/BANDAGES/DRESSINGS) IMPLANT
SUCTION FRAZIER HANDLE 10FR (MISCELLANEOUS)
SUCTION TUBE FRAZIER 10FR DISP (MISCELLANEOUS) IMPLANT
SUPPORT WRAP ARM LG (MISCELLANEOUS) IMPLANT
SUT BONE WAX W31G (SUTURE) IMPLANT
SUT ETHILON 3 0 PS 1 (SUTURE) ×3 IMPLANT
SUT FIBERWIRE #2 38 T-5 BLUE (SUTURE)
SUT MNCRL AB 3-0 PS2 18 (SUTURE) IMPLANT
SUT MNCRL AB 4-0 PS2 18 (SUTURE) IMPLANT
SUT PDS AB 0 CT 36 (SUTURE) IMPLANT
SUT PROLENE 3 0 PS 2 (SUTURE) IMPLANT
SUT TIGER TAPE 7 IN WHITE (SUTURE) IMPLANT
SUT VIC AB 0 CT1 27 (SUTURE)
SUT VIC AB 0 CT1 27XBRD ANBCTR (SUTURE) IMPLANT
SUT VIC AB 2-0 SH 27 (SUTURE)
SUT VIC AB 2-0 SH 27XBRD (SUTURE) IMPLANT
SUTURE FIBERWR #2 38 T-5 BLUE (SUTURE) IMPLANT
SYR BULB 3OZ (MISCELLANEOUS) IMPLANT
TAPE FIBER 2MM 7IN #2 BLUE (SUTURE) ×2 IMPLANT
TOWEL OR 17X24 6PK STRL BLUE (TOWEL DISPOSABLE) ×3 IMPLANT
TOWEL OR NON WOVEN STRL DISP B (DISPOSABLE) ×3 IMPLANT
TUBE CONNECTING 20'X1/4 (TUBING) ×1
TUBE CONNECTING 20X1/4 (TUBING) ×2 IMPLANT
TUBING ARTHROSCOPY IRRIG 16FT (MISCELLANEOUS) ×3 IMPLANT
WAND STAR VAC 90 (SURGICAL WAND) ×3 IMPLANT
WATER STERILE IRR 1000ML POUR (IV SOLUTION) ×3 IMPLANT
YANKAUER SUCT BULB TIP NO VENT (SUCTIONS) IMPLANT

## 2016-01-08 NOTE — Discharge Instructions (Signed)
Discharge Instructions after Arthroscopic Shoulder Repair ° ° °A sling has been provided for you. Remain in your sling at all times. This includes sleeping in your sling.  °Use ice on the shoulder intermittently over the first 48 hours after surgery.  °Pain medicine has been prescribed for you.  °Use your medicine liberally over the first 48 hours, and then you can begin to taper your use. You may take Extra Strength Tylenol or Tylenol only in place of the pain pills. DO NOT take ANY nonsteroidal anti-inflammatory pain medications: Advil, Motrin, Ibuprofen, Aleve, Naproxen, or Narprosyn.  °You may remove your dressing after two days. If the incision sites are still moist, place a Band-Aid over the moist site(s). Change Band-Aids daily until dry.  °You may shower 5 days after surgery. The incisions CANNOT get wet prior to 5 days. Simply allow the water to wash over the site and then pat dry. Do not rub the incisions. Make sure your axilla (armpit) is completely dry after showering.  °Take one aspirin a day for 2 weeks after surgery, unless you have an aspirin sensitivity/ allergy or asthma. ° ° °Please call [phone] during normal business hours or [phone] after hours for any problems. Including the following: ° °- excessive redness of the incisions °- drainage for more than 4 days °- fever of more than 101.5 F ° °*Please note that pain medications will not be refilled after hours or on weekends. ° ° °Post Anesthesia Home Care Instructions ° °Activity: °Get plenty of rest for the remainder of the day. A responsible adult should stay with you for 24 hours following the procedure.  °For the next 24 hours, DO NOT: °-Drive a car °-Operate machinery °-Drink alcoholic beverages °-Take any medication unless instructed by your physician °-Make any legal decisions or sign important papers. ° °Meals: °Start with liquid foods such as gelatin or soup. Progress to regular foods as tolerated. Avoid greasy, spicy, heavy  foods. If nausea and/or vomiting occur, drink only clear liquids until the nausea and/or vomiting subsides. Call your physician if vomiting continues. ° °Special Instructions/Symptoms: °Your throat may feel dry or sore from the anesthesia or the breathing tube placed in your throat during surgery. If this causes discomfort, gargle with warm salt water. The discomfort should disappear within 24 hours. ° °If you had a scopolamine patch placed behind your ear for the management of post- operative nausea and/or vomiting: ° °1. The medication in the patch is effective for 72 hours, after which it should be removed.  Wrap patch in a tissue and discard in the trash. Wash hands thoroughly with soap and water. °2. You may remove the patch earlier than 72 hours if you experience unpleasant side effects which may include dry mouth, dizziness or visual disturbances. °3. Avoid touching the patch. Wash your hands with soap and water after contact with the patch. °  °Regional Anesthesia Blocks ° °1. Numbness or the inability to move the "blocked" extremity may last from 3-48 hours after placement. The length of time depends on the medication injected and your individual response to the medication. If the numbness is not going away after 48 hours, call your surgeon. ° °2. The extremity that is blocked will need to be protected until the numbness is gone and the  Strength has returned. Because you cannot feel it, you will need to take extra care to avoid injury. Because it may be weak, you may have difficulty moving it or using it. You may not know   what position it is in without looking at it while the block is in effect. ° °3. For blocks in the legs and feet, returning to weight bearing and walking needs to be done carefully. You will need to wait until the numbness is entirely gone and the strength has returned. You should be able to move your leg and foot normally before you try and bear weight or walk. You will need someone to  be with you when you first try to ensure you do not fall and possibly risk injury. ° °4. Bruising and tenderness at the needle site are common side effects and will resolve in a few days. ° °5. Persistent numbness or new problems with movement should be communicated to the surgeon or the [facility] ([phone])/ [facility] ([phone]). ° ° ° °

## 2016-01-08 NOTE — Progress Notes (Signed)
Assisted Dr. Denenny with right, ultrasound guided, interscalene  block. Side rails up, monitors on throughout procedure. See vital signs in flow sheet. Tolerated Procedure well. 

## 2016-01-08 NOTE — Anesthesia Procedure Notes (Signed)
Anesthesia Regional Block:  Interscalene brachial plexus block  Pre-Anesthetic Checklist: ,, timeout performed, Correct Patient, Correct Site, Correct Laterality, Correct Procedure, Correct Position, site marked, Risks and benefits discussed,  Surgical consent,  Pre-op evaluation,  At surgeon's request and post-op pain management  Laterality: Right and Upper  Prep: chloraprep       Needles:  Injection technique: Single-shot  Needle Type: Echogenic Needle     Needle Length: 10cm 10 cm Needle Gauge: 21 and 21 G    Additional Needles:  Procedures: ultrasound guided (picture in chart) Interscalene brachial plexus block Narrative:  Start time: 01/08/2016 10:35 AM Injection made incrementally with aspirations every 5 mL.  Performed by: Personally  Anesthesiologist: Sherrian DiversENENNY, Taiwo Fish  Additional Notes: No pain with injection. No increased resistance to injection. Tolerated well. RUE motor intact immediately following block. Loss of deltoid function at 20 minutes.

## 2016-01-08 NOTE — Op Note (Signed)
Procedure(s): SHOULDER ARTHROSCOPY WITH ROTATOR CUFF REPAIR Procedure Note  Benjamin [name] male 50 y.o. 01/08/2016  Procedure(s) and Anesthesia Type:    * SHOULDER ARTHROSCOPY WITH ROTATOR CUFF REPAIR - General  Surgeon(s) and Role:    * Jones Broom, MD - Primary     Surgeon: Mable Paris   Assistants: Damita Lack PA-C (Danielle was present and scrubbed throughout the procedure and was essential in positioning, assisting with the camera and instrumentation,, and closure)  Anesthesia: General endotracheal anesthesia with preoperative interscalene block given by the attending anesthesiologist    Procedure Detail  SHOULDER ARTHROSCOPY WITH ROTATOR CUFF REPAIR  Estimated Blood Loss: Min         Drains: none  Blood Given: none         Specimens: none        Complications:  * No complications entered in OR log *         Disposition: PACU - hemodynamically stable.         Condition: stable    Procedure:   INDICATIONS FOR SURGERY: The patient is 50 y.o. male who had right shoulder arthroscopy with biceps tenotomy and debridement upper border subscapularis tear over one year ago. He went on to have continued anterior shoulder pain which localized to subscapularis. After failing further conservative management with injections and therapy he was indicated for a second look at the subscapularis with formal repair to alleviate pain. He understood risks benefits and alternatives to the procedure and wished to go forward with surgery.  OPERATIVE FINDINGS: Examination under anesthesia: No stiffness or instability   DESCRIPTION OF PROCEDURE: The patient was identified in preoperative  holding area where I personally marked the operative site after  verifying site, side, and procedure with the patient. An interscalene block was given by the attending anesthesiologist the holding area.  The patient was taken back to the operating room where general anesthesia  was induced without complication and was placed in the beach-chair position with the back  elevated about 60 degrees and all extremities and head and neck carefully padded and  positioned.   The right upper extremity was then prepped and  draped in a standard sterile fashion. The appropriate time-out  procedure was carried out. The patient did receive IV antibiotics  within 30 minutes of incision.   A small posterior portal incision was made and the arthroscope was introduced into the joint. An anterior portal was then established above the subscapularis using needle localization. Small cannula was placed anteriorly. Diagnostic arthroscopy was then carried out.  The glenohumeral joint surfaces were intact without chondromalacia. The supraspinatus and infraspinatus were intact. Anteriorly he was noted to have a partial upper border split tear of the subscapularis with significant detachment of the upper tendinous portion. The remaining portion of the upper half which was still attached to the lesser tuberosity was very thin and attenuated. This was extensively debrided with a shaver releasing the small upper fibers still attached to the lesser tuberosity. The upper border tear was debrided in the longitudinal split component to promote healing. It was then reduced to the lesser tuberosity and with reduction did appear anatomic. Therefore a bur was used to promote bleeding on the lesser tuberosity and a small cannula was placed through a high rotator interval portal position. There was some scar tissue on the upper portion which was released to allow full excursion. The repair was then carried out by passing a fiber tape in an inverted mattress configuration through  the upper torn portion and bringing this into a 4.5 mm push in peak anchor reducing the tendon back to the prepared tuberosity into the lower portion of the intact tendon nicely. This allowed for anatomic repair. With external rotation and was  no significant tension on the repair.  The arthroscopic equipment was removed from the joint and the portals were closed with 3-0 nylon in an interrupted fashion. Sterile dressings were then applied including Xeroform 4 x 4's ABDs and tape. The patient was then allowed to awaken from general anesthesia, placed in a sling, transferred to the stretcher and taken to the recovery room in stable condition.   POSTOPERATIVE PLAN: The patient will be discharged home today and will followup in one week for suture removal and wound check.  He will follow the standard cuff protocol but we will avoid external rotation beyond 20 until 6 weeks.

## 2016-01-08 NOTE — Anesthesia Procedure Notes (Signed)
Anesthesia Procedure Image    

## 2016-01-08 NOTE — Anesthesia Procedure Notes (Signed)
Procedure Name: Intubation Date/Time: 01/08/2016 11:53 AM Performed by: Zenia ResidesPAYNE, Trenee Igoe D Pre-anesthesia Checklist: Patient identified, Emergency Drugs available, Suction available and Patient being monitored Patient Re-evaluated:Patient Re-evaluated prior to inductionOxygen Delivery Method: Circle system utilized Preoxygenation: Pre-oxygenation with 100% oxygen Intubation Type: IV induction Ventilation: Mask ventilation without difficulty Laryngoscope Size: Mac and 3 Grade View: Grade III Tube type: Oral Number of attempts: 2 Airway Equipment and Method: Stylet and Oral airway Placement Confirmation: ETT inserted through vocal cords under direct vision,  positive ETCO2 and breath sounds checked- equal and bilateral Secured at: 23 cm Tube secured with: Tape Dental Injury: Teeth and Oropharynx as per pre-operative assessment  Difficulty Due To: Difficulty was anticipated, Difficult Airway- due to dentition and Difficult Airway- due to reduced neck mobility

## 2016-01-08 NOTE — Transfer of Care (Signed)
Immediate Anesthesia Transfer of Care Note  Patient: Benjamin [name]  Procedure(s) Performed: Procedure(s) with comments: SHOULDER ARTHROSCOPY WITH ROTATOR CUFF REPAIR (Right) - Right shoulder arthroscopy rotator cuff tear  Patient Location: PACU  Anesthesia Type:GA combined with regional for post-op pain  Level of Consciousness: awake and alert   Airway & Oxygen Therapy: Patient Spontanous Breathing and Patient connected to face mask oxygen  Post-op Assessment: Report given to RN and Post -op Vital signs reviewed and stable  Post vital signs: Reviewed and stable  Last Vitals:  Vitals:   01/08/16 1055 01/08/16 1100  BP: 137/70 (!) 135/108  Pulse: 72 74  Resp: 17 17  Temp:      Last Pain:  Vitals:   01/08/16 0953  TempSrc: Oral         Complications: No apparent anesthesia complications

## 2016-01-08 NOTE — H&P (Signed)
Benjamin [name] is an 50 y.o. male.   Chief Complaint: R shoulder pain HPI: R shoulder pain with partial thickness subscap tear, failed injections, debridement.  Past Medical History:  Diagnosis Date  . Hypertension     Past Surgical History:  Procedure Laterality Date  . ANKLE SURGERY    . CARDIAC CATHETERIZATION N/A 01/06/2015   Procedure: Left Heart Cath and Coronary Angiography;  Surgeon: Yates DecampJay Ganji, MD;  [facility];  Service: Cardiovascular;  Laterality: N/A;  . head surgery     laceration secondary to trauma  . rib fractures      History reviewed. No pertinent family history. Social History:  reports that he has never smoked. He has never used smokeless tobacco. He reports that he drinks alcohol. He reports that he does not use drugs.  Allergies:  Allergies  Allergen Reactions  . Morphine And Related Itching    Medications Prior to Admission  Medication Sig Dispense Refill  . amLODipine (NORVASC) 10 MG tablet Take 10 mg by mouth daily.  3  . aspirin EC 81 MG tablet Take 162 mg by mouth daily.    Marland Kitchen. atorvastatin (LIPITOR) 40 MG tablet Take 40 mg by mouth at bedtime.  3  . losartan (COZAAR) 100 MG tablet Take 100 mg by mouth daily. for blood pressure  3  . metoprolol tartrate (LOPRESSOR) 25 MG tablet Take 25 mg by mouth 2 (two) times daily.  3  . NITROSTAT 0.4 MG SL tablet Place 0.4 mg under the tongue every 5 (five) minutes as needed for chest pain.   0    No results found for this or any previous visit (from the past 48 hour(s)). No results found.  Review of Systems  All other systems reviewed and are negative.   Blood pressure (!) 135/108, pulse 74, temperature 98.2 F (36.8 C), temperature source Oral, resp. rate 17, height 6' (1.829 m), weight 100.7 kg (222 lb), SpO2 95 %. Physical Exam  Constitutional: He is oriented to person, place, and time. He appears well-developed and well-nourished.  HENT:  Head: Atraumatic.  Eyes: EOM are normal.   Cardiovascular: Intact distal pulses.   Respiratory: Effort normal.  Musculoskeletal:  Pain with subscap testing. TTP anterior shoulder R  Neurological: He is alert and oriented to person, place, and time.  Skin: Skin is warm and dry.  Psychiatric: He has a normal mood and affect.     Assessment/Plan R shoulder pain with partial thickness subscap tear, failed injections, debridement. Plan R shoulder arth RCR Risks / benefits of surgery discussed Consent on chart  NPO for OR Preop antibiotics   Mable ParisHANDLER,Benjamin Cheuvront WILLIAM, MD 01/08/2016, 11:35 AM

## 2016-01-08 NOTE — Anesthesia Preprocedure Evaluation (Addendum)
Anesthesia Evaluation  Patient identified by MRN, date of birth, ID band Patient awake and Patient confused    Reviewed: Allergy & Precautions, NPO status , Patient's Chart, lab work & pertinent test results  Airway Mallampati: II  TM Distance: >3 FB Neck ROM: Full    Dental no notable dental hx.    Pulmonary neg pulmonary ROS,    Pulmonary exam normal breath sounds clear to auscultation       Cardiovascular Exercise Tolerance: Good hypertension, Pt. on medications and Pt. on home beta blockers + angina Normal cardiovascular exam Rhythm:Regular Rate:Normal  Heart cath 2016:  Normal coronaries. EF 50%   Neuro/Psych negative neurological ROS  negative psych ROS   GI/Hepatic negative GI ROS, Neg liver ROS,   Endo/Other  negative endocrine ROS  Renal/GU negative Renal ROS  negative genitourinary   Musculoskeletal negative musculoskeletal ROS (+)   Abdominal   Peds negative pediatric ROS (+)  Hematology negative hematology ROS (+)   Anesthesia Other Findings   Reproductive/Obstetrics negative OB ROS                           Anesthesia Physical Anesthesia Plan  ASA: II  Anesthesia Plan: General   Post-op Pain Management: GA combined w/ Regional for post-op pain   Induction: Intravenous  Airway Management Planned: Nasal ETT  Additional Equipment:   Intra-op Plan:   Post-operative Plan: Extubation in OR  Informed Consent: I have reviewed the patients History and Physical, chart, labs and discussed the procedure including the risks, benefits and alternatives for the proposed anesthesia with the patient or authorized representative who has indicated his/her understanding and acceptance.   Dental advisory given  Plan Discussed with: CRNA  Anesthesia Plan Comments: (Discussed risks and benefits of interscalene block including failure, bleeding, infection, nerve damage, weakness.  Questions answered. Patient consents to block.)       Anesthesia Quick Evaluation

## 2016-01-08 NOTE — Anesthesia Postprocedure Evaluation (Signed)
Anesthesia Post Note  Patient: Benjamin HarpsJesse R Eve  Procedure(s) Performed: Procedure(s) (LRB): SHOULDER ARTHROSCOPY WITH ROTATOR CUFF REPAIR (Right)  Patient location during evaluation: PACU Anesthesia Type: General and Regional Level of consciousness: awake and alert Pain management: pain level controlled Vital Signs Assessment: post-procedure vital signs reviewed and stable Respiratory status: spontaneous breathing, nonlabored ventilation, respiratory function stable and patient connected to nasal cannula oxygen Cardiovascular status: blood pressure returned to baseline and stable Postop Assessment: no signs of nausea or vomiting Anesthetic complications: no    Last Vitals:  Vitals:   01/08/16 1430 01/08/16 1500  BP: 128/77 138/65  Pulse: 82 72  Resp: 16 16  Temp:  36.5 C    Last Pain:  Vitals:   01/08/16 0953  TempSrc: Oral                 [name]

## 2016-01-09 NOTE — Addendum Note (Signed)
Addendum  created 01/09/16 [id number] by Lance CoonWesley Encarnacion Bole, CRNA   Charge Capture section accepted

## 2016-01-10 ENCOUNTER — Encounter (HOSPITAL_BASED_OUTPATIENT_CLINIC_OR_DEPARTMENT_OTHER): Payer: Self-pay | Admitting: Orthopedic Surgery

## 2016-05-18 IMAGING — CR DG LUMBAR SPINE COMPLETE 4+V
5 series · 5 of 5 positions shown · non-contrast
Comparison: 02/07/2012.

CLINICAL DATA: Chronic back pain for 5 years, no trauma.

EXAM:
LUMBAR SPINE - COMPLETE 4+ VIEW

[t l-spine a.p.]
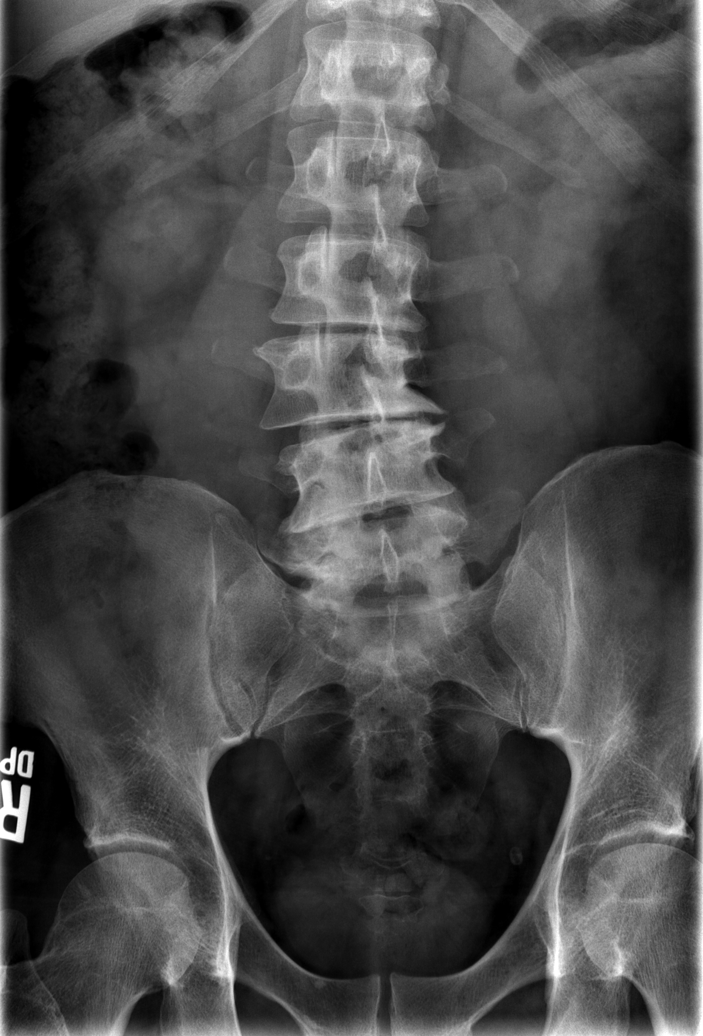

[t l-spine oblique exposure (1 of 2)]
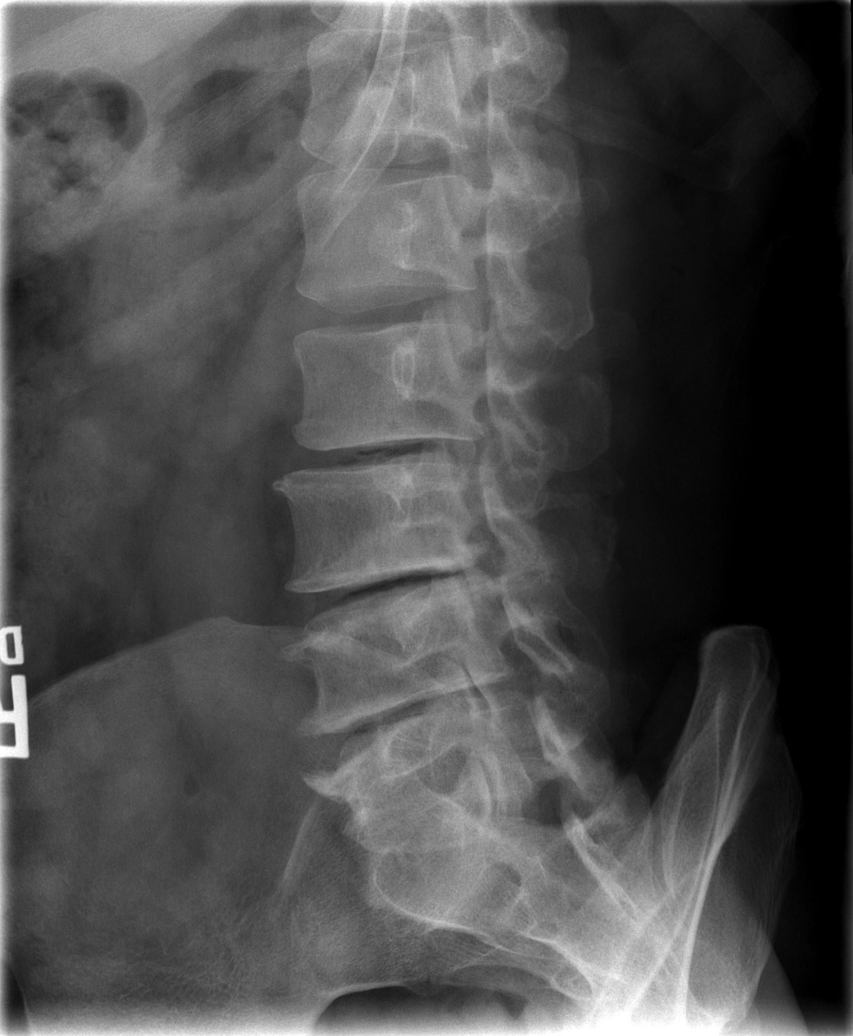

[t l-spine oblique exposure (2 of 2)]
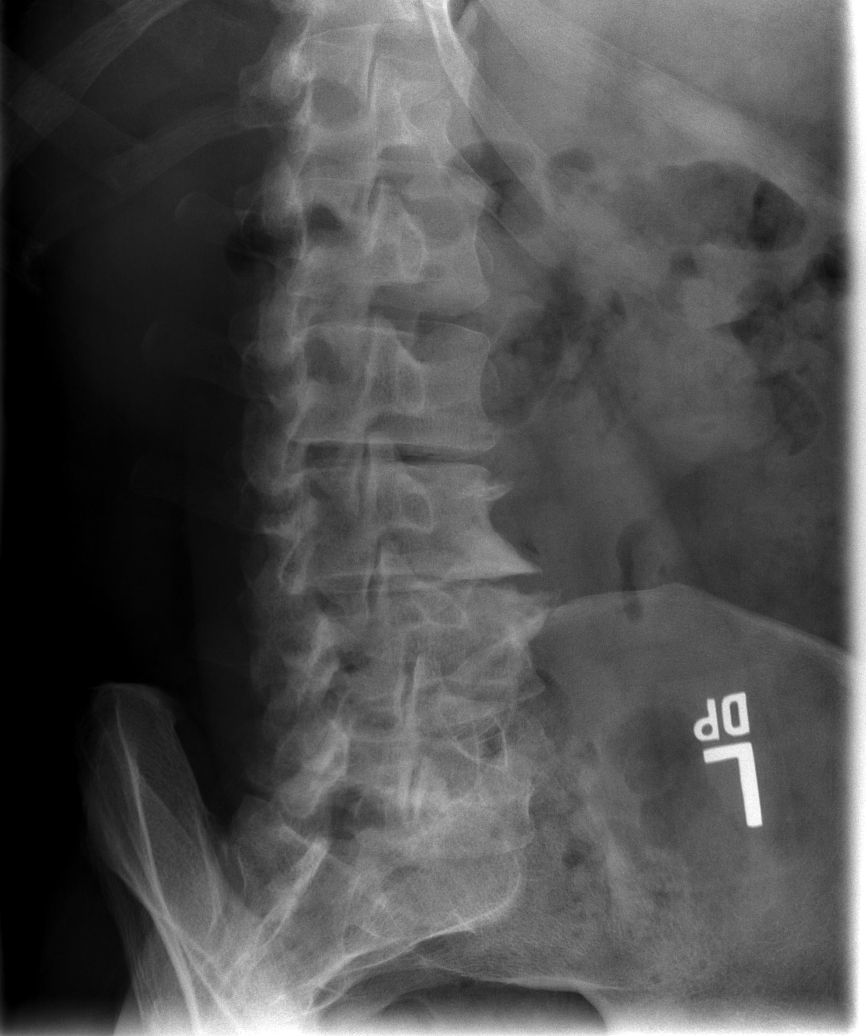

[t l-spine lat]
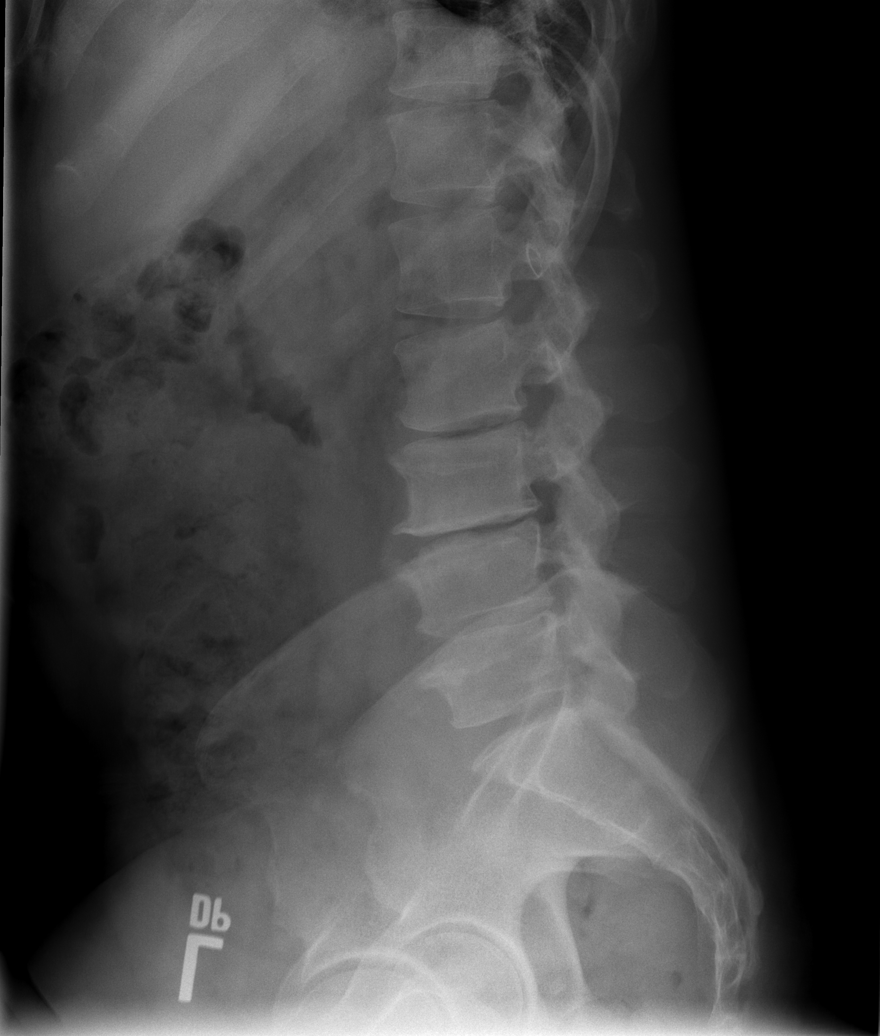

[t l-spine l5-s1 spot]
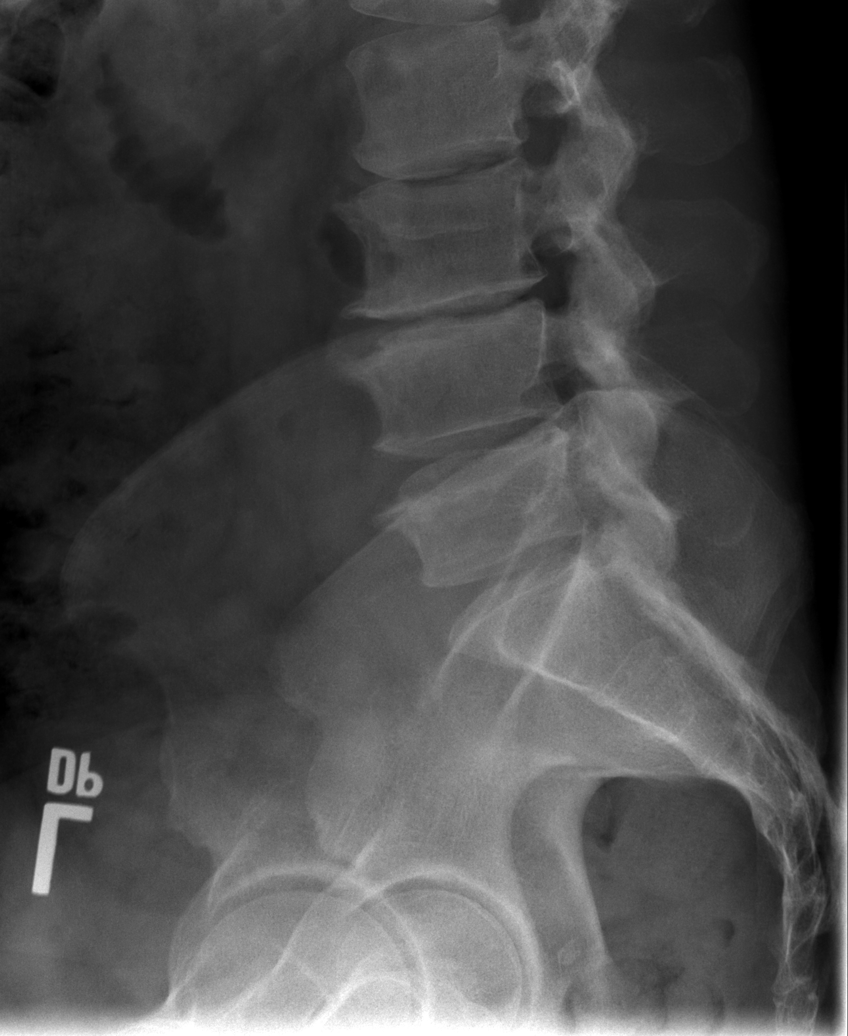

[5 of 5 positions shown; findings below may reference images not displayed]

FINDINGS: Mild dextro convex rotatory scoliosis is centered at L3, stable.
There is straightening of the normal lumbar lordosis. Vertebral body
height is maintained. Endplate degenerative changes are seen from
L2-3 to L4-5, with associated loss of disc space height. Facet
sclerosis in the lower lumbar spine. Findings appear roughly stable
from 02/07/2012. No definite pars defects.
IMPRESSION: Degenerative disc disease from L2-3 to L4-5, grossly stable from
02/07/2012.

## 2019-07-07 ENCOUNTER — Emergency Department (HOSPITAL_COMMUNITY)
Admission: EM | Admit: 2019-07-07 | Discharge: 2019-07-07 | Disposition: A | Discharge status: Home / Self Care | Payer: No Typology Code available for payment source | Attending: Emergency Medicine | Admitting: Emergency Medicine

## 2019-07-07 ENCOUNTER — Emergency Department (HOSPITAL_COMMUNITY): Payer: No Typology Code available for payment source

## 2019-07-07 DIAGNOSIS — M25571 Pain in right ankle and joints of right foot: Secondary | ICD-10-CM | POA: Insufficient documentation

## 2019-07-07 DIAGNOSIS — Y999 Unspecified external cause status: Secondary | ICD-10-CM | POA: Insufficient documentation

## 2019-07-07 DIAGNOSIS — Y939 Activity, unspecified: Secondary | ICD-10-CM | POA: Diagnosis not present

## 2019-07-07 DIAGNOSIS — R918 Other nonspecific abnormal finding of lung field: Secondary | ICD-10-CM | POA: Diagnosis not present

## 2019-07-07 DIAGNOSIS — R519 Headache, unspecified: Secondary | ICD-10-CM | POA: Diagnosis not present

## 2019-07-07 DIAGNOSIS — R93421 Abnormal radiologic findings on diagnostic imaging of right kidney: Secondary | ICD-10-CM | POA: Diagnosis not present

## 2019-07-07 DIAGNOSIS — Y929 Unspecified place or not applicable: Secondary | ICD-10-CM | POA: Insufficient documentation

## 2019-07-07 DIAGNOSIS — T07XXXA Unspecified multiple injuries, initial encounter: Secondary | ICD-10-CM | POA: Diagnosis present

## 2019-07-07 DIAGNOSIS — S0181XA Laceration without foreign body of other part of head, initial encounter: Secondary | ICD-10-CM | POA: Insufficient documentation

## 2019-07-07 LAB — CBC
HCT: 43.1 % (ref 39.0–52.0)
Hemoglobin: 13.9 g/dL (ref 13.0–17.0)
MCH: 27.7 pg (ref 26.0–34.0)
MCHC: 32.3 g/dL (ref 30.0–36.0)
MCV: 85.9 fL (ref 80.0–100.0)
Platelets: 304 10*3/uL (ref 150–400)
RBC: 5.02 MIL/uL (ref 4.22–5.81)
RDW: 14.8 % (ref 11.5–15.5)
WBC: 7.4 10*3/uL (ref 4.0–10.5)
nRBC: 0 % (ref 0.0–0.2)

## 2019-07-07 LAB — COMPREHENSIVE METABOLIC PANEL
ALT: 38 U/L (ref 0–44)
AST: 37 U/L (ref 15–41)
Albumin: 4.3 g/dL (ref 3.5–5.0)
Alkaline Phosphatase: 44 U/L (ref 38–126)
Anion gap: 12 (ref 5–15)
BUN: 17 mg/dL (ref 6–20)
CO2: 22 mmol/L (ref 22–32)
Calcium: 9.8 mg/dL (ref 8.9–10.3)
Chloride: 104 mmol/L (ref 98–111)
Creatinine, Ser: 1.4 mg/dL — ABNORMAL HIGH (ref 0.61–1.24)
GFR calc Af Amer: >60 mL/min (ref >60)
GFR calc non Af Amer: 56 mL/min — ABNORMAL LOW (ref >60)
Glucose, Bld: 111 mg/dL — ABNORMAL HIGH (ref 70–99)
Potassium: 4.7 mmol/L (ref 3.5–5.1)
Sodium: 138 mmol/L (ref 135–145)
Total Bilirubin: 0.6 mg/dL (ref 0.3–1.2)
Total Protein: 7.9 g/dL (ref 6.5–8.1)

## 2019-07-07 LAB — I-STAT CHEM 8, ED
BUN: 19 mg/dL (ref 6–20)
Calcium, Ion: 1.06 mmol/L — ABNORMAL LOW (ref 1.15–1.40)
Chloride: 105 mmol/L (ref 98–111)
Creatinine, Ser: 1.4 mg/dL — ABNORMAL HIGH (ref 0.61–1.24)
Glucose, Bld: 108 mg/dL — ABNORMAL HIGH (ref 70–99)
HCT: 43 % (ref 39.0–52.0)
Hemoglobin: 14.6 g/dL (ref 13.0–17.0)
Potassium: 4.6 mmol/L (ref 3.5–5.1)
Sodium: 138 mmol/L (ref 135–145)
TCO2: 24 mmol/L (ref 22–32)

## 2019-07-07 LAB — URINALYSIS, ROUTINE W REFLEX MICROSCOPIC
Bilirubin Urine: NEGATIVE
Glucose, UA: NEGATIVE mg/dL
Hgb urine dipstick: NEGATIVE
Ketones, ur: NEGATIVE mg/dL
Leukocytes,Ua: NEGATIVE
Nitrite: NEGATIVE
Protein, ur: NEGATIVE mg/dL
Specific Gravity, Urine: 1.028 (ref 1.005–1.030)
pH: 5 (ref 5.0–8.0)

## 2019-07-07 LAB — SAMPLE TO BLOOD BANK

## 2019-07-07 LAB — PROTIME-INR
INR: 1 (ref 0.8–1.2)
Prothrombin Time: 12.6 seconds (ref 11.4–15.2)

## 2019-07-07 LAB — CDS SEROLOGY

## 2019-07-07 LAB — LACTIC ACID, PLASMA: Lactic Acid, Venous: 1.9 mmol/L (ref 0.5–1.9)

## 2019-07-07 LAB — ETHANOL: Alcohol, Ethyl (B): <10 mg/dL (ref <10)

## 2019-07-07 MED ORDER — IOHEXOL 300 MG/ML  SOLN
100.0000 mL | Freq: Once | INTRAMUSCULAR | Status: AC | PRN
  Administered 2019-07-07: 100 mL via INTRAVENOUS

## 2019-07-07 MED ORDER — LIDOCAINE HCL (PF) 1 % IJ SOLN
5.0000 mL | Freq: Once | INTRAMUSCULAR | Status: DC
  Filled 2019-07-07: qty 5
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
## 2019-07-07 NOTE — Discharge Instructions (Addendum)
As discussed, today's evaluation has been generally reassuring. Your CT scans and x-rays do not demonstrate broken bones, nor internal bleeding.  However, if you develop new, or concerning changes, please be sure to return here. Otherwise, please use ice, Tylenol for pain relief and get plenty of rest. Your sutures should be removed in 5 days.  Please keep your wound clean and dry. Your ankle has been placed in a splint.  If it improves in 3 days you may remove this safely.  If it does not improve, please be sure to follow-up either here or with your orthopedic physician.  In addition, your CT, though generally reassuring did demonstrate a nonabnormal finding in your abdomen that requires conversation with your physician in regards to appropriate follow-up.  When you make your appointment, please bring this interpretation and discuss it at that point.  CT IMPRESSION:  1. No evidence of acute traumatic injury within the chest, abdomen,  or pelvis.  2. Indeterminate 3.1 cm lesion in the upper pole of the right  kidney. Recommend further evaluation with non-emergent renal  protocol CT or MRI with and without contrast.  3. 3 mm subpleural ground-glass nodule in the right middle lobe. No  follow-up recommended. This recommendation follows the consensus  statement: Guidelines for Management of Incidental Pulmonary Nodules  Detected on CT Images: From the Fleischner Society 2017; Radiology  2017; 284:228-243.
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
## 2019-07-19 ENCOUNTER — Encounter: Payer: Self-pay | Admitting: Neurology

## 2019-07-19 ENCOUNTER — Ambulatory Visit: Payer: Self-pay | Admitting: Neurology

## 2019-07-19 ENCOUNTER — Other Ambulatory Visit: Payer: Self-pay

## 2019-07-19 VITALS — BP 144/86 | HR 81 | Temp 97.6°F | Ht 72.0 in | Wt 226.3 lb

## 2019-07-19 DIAGNOSIS — G44309 Post-traumatic headache, unspecified, not intractable: Secondary | ICD-10-CM

## 2019-07-19 MED ORDER — AMITRIPTYLINE HCL 25 MG PO TABS: ORAL_TABLET | ORAL | 3 refills | Status: DC
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
## 2019-07-19 NOTE — Patient Instructions (Addendum)
You have had post-concussion headaches. These can last for weeks and sometimes months after the initial head injury. Most people who have a concussion are eventually without any residual symptoms. Some patients have symptoms that occur within the first 7-10 days of the initial injury and had complete resolution of her symptoms within 3 months. Care is supportive. Treatment is geared towards improving symptoms. Medications commonly used for migraines or tension headaches, including some antidepressants, appear to be helpful with postconcussive headaches. Medications include amitriptyline, Topamax, or gabapentin and others. Keep in mind that overuse of over-the-counter pain medications, including prescription ibuprofen can exacerbate post concussion headaches.  Please remember, common headache triggers are: sleep deprivation, dehydration, overheating, stress, hypoglycemia or skipping meals and blood sugar fluctuations, excessive pain medications or excessive alcohol use or caffeine withdrawal. Some people have food triggers such as aged cheese, orange juice or chocolate, especially dark chocolate, or MSG (monosodium glutamate). Try to avoid these headache triggers as much possible. It may be helpful to keep a headache diary to figure out what makes your headaches worse or brings them on and what alleviates them.   As discussed, we will start you on Elavil (generic name: amitriptyline) 25 mg: Take half a pill daily at bedtime for one week, then one pill daily at bedtime for one week, then one and a half pills daily at bedtime for one week, then 2 pills daily at bedtime thereafter. Common side effects reported are: mouth dryness, drowsiness, confusion, dizziness.   We will follow up in 3 months.

## 2019-08-19 ENCOUNTER — Ambulatory Visit: Payer: Self-pay | Attending: Internal Medicine

## 2019-08-19 DIAGNOSIS — Z23 Encounter for immunization: Secondary | ICD-10-CM

## 2019-08-19 NOTE — Progress Notes (Signed)
   Covid-19 Vaccination Clinic  Name:  Benjamin [name]    MRN: [id number] DOB: [birth year]  08/19/2019  [name] was observed post Covid-19 immunization for 15 minutes without incident. He was provided with Vaccine Information Sheet and instruction to access the V-Safe system.   [name] was instructed to call 911 with any severe reactions post vaccine: Marland Kitchen Difficulty breathing  . Swelling of face and throat  . A fast heartbeat  . A bad rash all over body  . Dizziness and weakness   Immunizations Administered    Name Date Dose VIS Date Route   Pfizer COVID-19 Vaccine 08/19/2019  8:24 AM 0.3 mL 05/14/2019 Intramuscular   Manufacturer: [employer], [employer]   Lot: [id number]   NDC: [address]-2

## 2019-09-13 ENCOUNTER — Ambulatory Visit: Payer: Self-pay | Attending: Internal Medicine

## 2019-09-13 DIAGNOSIS — Z23 Encounter for immunization: Secondary | ICD-10-CM

## 2019-09-13 NOTE — Progress Notes (Signed)
   Covid-19 Vaccination Clinic  Name:  BREWER HITCHMAN    MRN: [id number] DOB: June 23, 1965  09/13/2019  Mr. Saintjean was observed post Covid-19 immunization for 15 minutes without incident. He was provided with Vaccine Information Sheet and instruction to access the V-Safe system.   Mr. Hakim was instructed to call 911 with any severe reactions post vaccine: Marland Kitchen Difficulty breathing  . Swelling of face and throat  . A fast heartbeat  . A bad rash all over body  . Dizziness and weakness   Immunizations Administered    Name Date Dose VIS Date Route   Pfizer COVID-19 Vaccine 09/13/2019  8:22 AM 0.3 mL 05/14/2019 Intramuscular   Manufacturer: [employer], [employer]   Lot: [id number]   NDC: [address]-9

## 2019-10-19 ENCOUNTER — Ambulatory Visit: Payer: Self-pay | Admitting: Neurology

## 2019-10-20 ENCOUNTER — Other Ambulatory Visit: Payer: Self-pay

## 2019-10-20 ENCOUNTER — Encounter: Payer: Self-pay | Admitting: Neurology

## 2019-10-20 ENCOUNTER — Ambulatory Visit: Payer: Self-pay | Admitting: Neurology

## 2019-10-20 VITALS — BP 146/91 | HR 86 | Ht 72.0 in | Wt 229.0 lb

## 2019-10-20 DIAGNOSIS — R0683 Snoring: Secondary | ICD-10-CM

## 2019-10-20 DIAGNOSIS — R519 Headache, unspecified: Secondary | ICD-10-CM

## 2019-10-20 DIAGNOSIS — E669 Obesity, unspecified: Secondary | ICD-10-CM

## 2019-10-20 DIAGNOSIS — G479 Sleep disorder, unspecified: Secondary | ICD-10-CM

## 2019-10-20 MED ORDER — AMITRIPTYLINE HCL 25 MG PO TABS: 50.0000 mg | ORAL_TABLET | Freq: Every day | ORAL | 3 refills | Status: DC

## 2019-10-20 NOTE — Patient Instructions (Signed)
I am glad to hear, you are feeling better and sleeping a little better too. Your exam is stable and not alarming. As discussed, we will proceed with a home sleep test to look into the possibility that you have sleep apnea which can be a contributor to recurrent headaches.  Please continue with the amitriptyline at the current dose, I have renewed your prescription for 90 days with refills.
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
## 2019-12-08 ENCOUNTER — Other Ambulatory Visit: Payer: Self-pay

## 2019-12-08 ENCOUNTER — Ambulatory Visit (INDEPENDENT_AMBULATORY_CARE_PROVIDER_SITE_OTHER): Payer: Self-pay | Admitting: Neurology

## 2019-12-08 DIAGNOSIS — R0683 Snoring: Secondary | ICD-10-CM

## 2019-12-08 DIAGNOSIS — E669 Obesity, unspecified: Secondary | ICD-10-CM

## 2019-12-08 DIAGNOSIS — G4733 Obstructive sleep apnea (adult) (pediatric): Secondary | ICD-10-CM

## 2019-12-08 DIAGNOSIS — R519 Headache, unspecified: Secondary | ICD-10-CM

## 2019-12-08 DIAGNOSIS — G479 Sleep disorder, unspecified: Secondary | ICD-10-CM

## 2019-12-20 ENCOUNTER — Telehealth: Payer: Self-pay

## 2019-12-20 NOTE — Telephone Encounter (Signed)
-----   Message from [name], MD sent at 12/20/2019  7:48 AM EDT ----- Patient referred by Dr. Okey Dupre, last seen by me on 10/20/19 for rec. HAs, HST on 12/08/19.    Please call and notify the patient that the recent home sleep test showed obstructive sleep apnea in the severe range. I recommend he start treatment in the form of autoPAP at home, through a DME company (of his choice, or as per insurance requirement). The DME representative will educate him on how to use the machine, how to put the mask on, etc. I have placed an order in the chart. Please send referral, talk to patient, send report to referring MD. We will need a FU in sleep clinic for 10 weeks post-PAP set up, please arrange that with me or one of our NPs. Thanks,   [name], MD, PhD [facility])

## 2019-12-20 NOTE — Procedures (Signed)
Patient Information     First Name: Benjamin Last Name: [name] ID: [id number]  Birth Date: June 11, 2065 Age: 54 Gender: Male  Referring Provider: Dr. Okey Dupre BMI: 31.1 (W=229 lb, H=6' 0'')  Sleep Study Information    Study Date: 12/08/19 S/H/A Version: [id number] / 4.1.1528 / 42  History:    54 year old man with a history of hypertension, hyperlipidemia, allergies, reflux disease, arthritis, status post R shoulder surgery, status post right ankle surgery, and borderline obesity, who reports daily headaches. He reports snoring and not sleeping well.  Summary & Diagnosis:     OSA Recommendations:     This home sleep test demonstrates severe obstructive sleep apnea with a total AHI of 30.6/hour and O2 nadir of 87%. Treatment with positive airway pressure is recommended. He will be advised to proceed with an autoPAP titration/trial at home for now. Alternative treatment may include dental treatment with an oral appliance or surgical options. Please note that untreated obstructive sleep apnea may carry additional perioperative morbidity. Patients with significant obstructive sleep apnea should receive perioperative PAP therapy and the surgeons and particularly the anesthesiologist should be informed of the diagnosis and the severity of the sleep disordered breathing. The patient should be cautioned not to drive, work at heights, or operate dangerous or heavy equipment when tired or sleepy. Review and reiteration of good sleep hygiene measures should be pursued with any patient. Other causes of the patient's symptoms, including circadian rhythm disturbances, an underlying mood disorder, medication effect and/or an underlying medical problem cannot be ruled out based on this test. Clinical correlation is recommended. The patient and his referring provider will be notified of the test results. The patient will be seen in follow up in sleep clinic at [facility].  I certify that I have reviewed the raw data recording  prior to the issuance of this report in accordance with the standards of the American Academy of Sleep Medicine (AASM).  [name], MD, PhD [facility]) Diplomat, ABPN (Neurology and Sleep)             Sleep Summary    Oxygen Saturation Statistics     Start Study Time: End Study Time: Total Recording Time:  9:52:07 PM 5:11:32 AM 7 h, 19 min  Total Sleep Time % REM of Sleep Time:  5 h, 14 min  11.3    Mean: 94 Minimum: 87 Maximum: 98  Mean of Desaturations Nadirs (%):  92  Oxygen Desaturation. %: 4-9 10-20 >20 Total  Events Number Total  66 100.0 0 0  0.0 0.0  66 100.0  Oxygen Saturation: <90 <=88  <85 <80 <70  Duration (minutes): Sleep % 0.3 0.1 0.1 0.0  0.0 0.0  0.0 0.0 0.0 0.0     Respiratory Indices      Total Events REM NREM All Night  pRDI:  133  pAHI:  131 ODI:  66  pAHIc:  22  % CSR: 0.0 5.0 5.0 2.5 0.0 33.7 33.2 16.7 5.7 31.1 30.6 15.4 5.1       Pulse Rate Statistics during Sleep (BPM)      Mean: 82 Minimum: 54 Maximum:  108    Indices are calculated using technically valid sleep time of 4 h, 16 min. pRDI/pAHI are calculated using oxi desaturations ? 3%  Body Position Statistics  Position Supine Prone Right Left Non-Supine  Sleep (min) 48.5 177.5 30.0 58.9 266.4  Sleep % 15.4 56.4 9.5 18.7 84.6  pRDI 48.3 28.0 62.4 7.6 27.7  pAHI 46.9 27.6 62.4 7.6 27.4  ODI 31.3 12.3 32.4 1.3 12.3     Snoring Statistics Snoring Level (dB) >40 >50 >60 >70 >80 >Threshold (45)  Sleep (min) 121.0 5.9 1.8 0.0 0.0 23.7  Sleep % 38.4 1.9 0.6 0.0 0.0 7.5    Mean: 41 dB Sleep Stages Chart                                               pAHI=30.6                                                           Mild              Moderate                    Severe                                                 5              15                    30

## 2019-12-20 NOTE — Telephone Encounter (Signed)
I called pt. No answer, left a message asking pt to call me back.   

## 2019-12-20 NOTE — Addendum Note (Signed)
Addended by: [name] on: 12/20/2019 07:48 AM   Modules accepted: Orders

## 2019-12-20 NOTE — Telephone Encounter (Signed)
Pt returned call for his results. Please call back when available.

## 2019-12-20 NOTE — Telephone Encounter (Signed)
I called Benjamin [name]. I advised Benjamin [name] that Dr. Frances Furbish reviewed their sleep study results and found that Benjamin [name] had severe osa. Dr. Frances Furbish recommends that Benjamin [name] start autopap for treatment. I reviewed PAP compliance expectations with the Benjamin [name]. Benjamin [name] is agreeable to starting an auto-PAP. I advised Benjamin [name] that an order will be sent to a DME, [facility], and [facility] will call the Benjamin [name] within about one week after they file with the Benjamin [name]'s insurance. [facility] will show the Benjamin [name] how to use the machine, fit for masks, and troubleshoot the auto-PAP if needed. A follow up appt was made for insurance purposes with Dr. Frances Furbish on 02/29/2020 at 830 am. Benjamin [name] verbalized understanding to arrive 15 minutes early and bring their auto-PAP. A letter with all of this information in it will be mailed to the Benjamin [name] as a reminder. I verified with the Benjamin [name] that the address we have on file is correct. Benjamin [name] verbalized understanding of results. Benjamin [name] had no questions at this time but was encouraged to call back if questions arise. I have sent the order to [facility] and have received confirmation that they have received the order.

## 2019-12-20 NOTE — Progress Notes (Signed)
Patient referred by Dr. Okey Dupre, last seen by me on 10/20/19 for rec. HAs, HST on 12/08/19.    Please call and notify the patient that the recent home sleep test showed obstructive sleep apnea in the severe range. I recommend he start treatment in the form of autoPAP at home, through a DME company (of his choice, or as per insurance requirement). The DME representative will educate him on how to use the machine, how to put the mask on, etc. I have placed an order in the chart. Please send referral, talk to patient, send report to referring MD. We will need a FU in sleep clinic for 10 weeks post-PAP set up, please arrange that with me or one of our NPs. Thanks,   [name], MD, PhD [facility]

## 2020-02-28 ENCOUNTER — Telehealth: Payer: Self-pay

## 2020-02-28 NOTE — Telephone Encounter (Signed)
I called the pt and left vm and asking if he had started the autopap machine? I could not locate in resmed.

## 2020-02-29 ENCOUNTER — Ambulatory Visit: Payer: Self-pay | Admitting: Neurology

## 2020-02-29 NOTE — Telephone Encounter (Signed)
Messaged [facility] yesterday in regards to the pt starting his cpap machine.. Received this message. He has not, he no-showed to his appt and the RT didn't tell us. Will contact pt for rescheduling.

## 2020-02-29 NOTE — Telephone Encounter (Signed)
Pt called and cancelled appt. Pt states he will call us back once he starts it. He states he no showed his appt to get his cpap machine due to a family emergency.

## 2020-02-29 NOTE — Telephone Encounter (Signed)
Okay, thank you for letting me know. We will reschedule appt accordingly.

## 2020-03-07 NOTE — Telephone Encounter (Addendum)
Received message from [facility] sating pt is scheduled to start CPAP on 03/18/2020. Pt has been scheduled for f/u appt on 05/30/2020 at 200 pm.  I have mailed a letter to the pt advising of this information and to call if this appt date/time does not work. Pt will need to be seen 31-90 days after starting his cpap on 03/18/2020.

## 2020-03-14 ENCOUNTER — Other Ambulatory Visit: Payer: Self-pay

## 2020-03-14 ENCOUNTER — Encounter: Payer: Self-pay | Admitting: Neurology

## 2020-03-14 ENCOUNTER — Ambulatory Visit: Payer: Self-pay | Admitting: Neurology

## 2020-03-14 VITALS — BP 145/93 | HR 89 | Ht 72.0 in | Wt 241.0 lb

## 2020-03-14 DIAGNOSIS — R519 Headache, unspecified: Secondary | ICD-10-CM

## 2020-03-14 DIAGNOSIS — G4733 Obstructive sleep apnea (adult) (pediatric): Secondary | ICD-10-CM

## 2020-03-14 DIAGNOSIS — M5441 Lumbago with sciatica, right side: Secondary | ICD-10-CM

## 2020-03-14 DIAGNOSIS — G8929 Other chronic pain: Secondary | ICD-10-CM

## 2020-03-14 MED ORDER — AMITRIPTYLINE HCL 25 MG PO TABS
75.0000 mg | ORAL_TABLET | Freq: Every day | ORAL | 1 refills | Status: DC
Start: 2020-03-14 — End: 2020-09-05

## 2020-03-14 NOTE — Patient Instructions (Signed)
For your increase in headaches, I would recommend that we increase your amitriptyline to 75 mg each night, I have adjusted your prescription for this.  I do believe that your headaches will improve once you are established on AutoPap therapy.  You have your set up date coming up.  Please follow-up as scheduled for sleep apnea recheck in the summer.  For your low back pain with radiation to the right leg I recommend evaluation through orthopedics.  I have made a referral.  As discussed, they may order an MRI of your lumbar spine once they see you in clinic.
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
## 2020-05-30 ENCOUNTER — Encounter: Payer: Self-pay | Admitting: Neurology

## 2020-05-30 ENCOUNTER — Ambulatory Visit: Payer: Self-pay | Admitting: Neurology

## 2020-05-30 ENCOUNTER — Telehealth: Payer: Self-pay

## 2020-05-30 NOTE — Telephone Encounter (Signed)
Pt did not show for their appt with Dr. Athar today.  

## 2020-09-05 ENCOUNTER — Other Ambulatory Visit: Payer: Self-pay | Admitting: Neurology

## 2023-04-09 ENCOUNTER — Emergency Department (HOSPITAL_COMMUNITY): Payer: No Typology Code available for payment source

## 2023-04-09 ENCOUNTER — Emergency Department (HOSPITAL_COMMUNITY)
Admission: EM | Admit: 2023-04-09 | Discharge: 2023-04-09 | Disposition: A | Discharge status: Home / Self Care | Payer: No Typology Code available for payment source | Attending: Emergency Medicine | Admitting: Emergency Medicine

## 2023-04-09 ENCOUNTER — Other Ambulatory Visit: Payer: Self-pay

## 2023-04-09 DIAGNOSIS — Y92410 Unspecified street and highway as the place of occurrence of the external cause: Secondary | ICD-10-CM | POA: Diagnosis not present

## 2023-04-09 DIAGNOSIS — S76312A Strain of muscle, fascia and tendon of the posterior muscle group at thigh level, left thigh, initial encounter: Secondary | ICD-10-CM | POA: Diagnosis not present

## 2023-04-09 DIAGNOSIS — Z7982 Long term (current) use of aspirin: Secondary | ICD-10-CM | POA: Insufficient documentation

## 2023-04-09 DIAGNOSIS — Z79899 Other long term (current) drug therapy: Secondary | ICD-10-CM | POA: Insufficient documentation

## 2023-04-09 DIAGNOSIS — I10 Essential (primary) hypertension: Secondary | ICD-10-CM | POA: Diagnosis not present

## 2023-04-09 DIAGNOSIS — M79652 Pain in left thigh: Secondary | ICD-10-CM | POA: Diagnosis present

## 2023-04-09 MED ORDER — METHOCARBAMOL 500 MG PO TABS
500.0000 mg | ORAL_TABLET | Freq: Once | ORAL | Status: AC
  Administered 2023-04-09: 500 mg via ORAL
  Filled 2023-04-09: qty 1

## 2023-04-09 MED ORDER — NAPROXEN 500 MG PO TABS: 500.0000 mg | ORAL_TABLET | Freq: Two times a day (BID) | ORAL | 0 refills | Status: AC

## 2023-04-09 MED ORDER — HYDROMORPHONE HCL 1 MG/ML IJ SOLN
0.5000 mg | Freq: Once | INTRAMUSCULAR | Status: AC
  Administered 2023-04-09: 0.5 mg via INTRAVENOUS
  Filled 2023-04-09: qty 1

## 2023-04-09 MED ORDER — OXYCODONE-ACETAMINOPHEN 5-325 MG PO TABS: 1.0000 | ORAL_TABLET | Freq: Four times a day (QID) | ORAL | 0 refills | Status: AC | PRN

## 2023-04-09 MED ORDER — HYDROCODONE-ACETAMINOPHEN 5-325 MG PO TABS
1.0000 | ORAL_TABLET | Freq: Once | ORAL | Status: AC
  Administered 2023-04-09: 1 via ORAL
  Filled 2023-04-09: qty 1

## 2023-04-09 MED ORDER — LIDOCAINE 5 % EX PTCH: 1.0000 | MEDICATED_PATCH | CUTANEOUS | 0 refills | Status: AC

## 2023-04-09 MED ORDER — METHOCARBAMOL 500 MG PO TABS: 500.0000 mg | ORAL_TABLET | Freq: Two times a day (BID) | ORAL | 0 refills | Status: AC

## 2023-04-09 MED ORDER — KETOROLAC TROMETHAMINE 30 MG/ML IJ SOLN
15.0000 mg | Freq: Once | INTRAMUSCULAR | Status: AC
  Administered 2023-04-09: 15 mg via INTRAVENOUS
  Filled 2023-04-09: qty 1
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
## 2023-04-09 NOTE — ED Notes (Signed)
Patient transported to CT with transport tech.
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
## 2023-04-09 NOTE — Progress Notes (Signed)
Orthopedic Tech Progress Note Patient Details:  Benjamin [name] May 04, 1966 [id number]  Ortho Devices Type of Ortho Device: Knee Immobilizer, Crutches Ortho Device/Splint Location: LLE Ortho Device/Splint Interventions: Ordered, Application, Adjustment   Post Interventions Patient Tolerated: Well, Ambulated well Instructions Provided: Poper ambulation with device, Care of device  [name] 04/09/2023, 11:04 PM
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
## 2023-08-25 ENCOUNTER — Other Ambulatory Visit (HOSPITAL_COMMUNITY): Payer: Self-pay | Admitting: Urology

## 2023-08-25 DIAGNOSIS — C61 Malignant neoplasm of prostate: Secondary | ICD-10-CM

## 2023-09-15 ENCOUNTER — Encounter (HOSPITAL_COMMUNITY)
